# Patient Record
Sex: Male | Born: 1970
Health system: Southern US, Community
[De-identification: ages and names within clinical notes are randomized; demographics above are authoritative.]

## PROBLEM LIST (undated history)

## (undated) DIAGNOSIS — E669 Obesity, unspecified: Secondary | ICD-10-CM

## (undated) DIAGNOSIS — N200 Calculus of kidney: Principal | ICD-10-CM

## (undated) DIAGNOSIS — Z720 Tobacco use: Secondary | ICD-10-CM

## (undated) DIAGNOSIS — Z9289 Personal history of other medical treatment: Secondary | ICD-10-CM

## (undated) DIAGNOSIS — G4733 Obstructive sleep apnea (adult) (pediatric): Secondary | ICD-10-CM

## (undated) DIAGNOSIS — R51 Headache: Secondary | ICD-10-CM

## (undated) DIAGNOSIS — Q631 Lobulated, fused and horseshoe kidney: Secondary | ICD-10-CM

## (undated) DIAGNOSIS — I471 Supraventricular tachycardia, unspecified: Secondary | ICD-10-CM

## (undated) DIAGNOSIS — R12 Heartburn: Secondary | ICD-10-CM

## (undated) DIAGNOSIS — I499 Cardiac arrhythmia, unspecified: Secondary | ICD-10-CM

## (undated) DIAGNOSIS — J3089 Other allergic rhinitis: Secondary | ICD-10-CM

## (undated) DIAGNOSIS — H9319 Tinnitus, unspecified ear: Secondary | ICD-10-CM

## (undated) DIAGNOSIS — Q245 Malformation of coronary vessels: Secondary | ICD-10-CM

## (undated) DIAGNOSIS — IMO0001 Reserved for inherently not codable concepts without codable children: Secondary | ICD-10-CM

## (undated) DIAGNOSIS — R030 Elevated blood-pressure reading, without diagnosis of hypertension: Secondary | ICD-10-CM

## (undated) DIAGNOSIS — Z789 Other specified health status: Secondary | ICD-10-CM

## (undated) DIAGNOSIS — Z9989 Dependence on other enabling machines and devices: Secondary | ICD-10-CM

## (undated) DIAGNOSIS — I2510 Atherosclerotic heart disease of native coronary artery without angina pectoris: Secondary | ICD-10-CM

## (undated) HISTORY — DX: Obstructive sleep apnea (adult) (pediatric): G47.33

## (undated) HISTORY — DX: Malformation of coronary vessels: Q24.5

## (undated) HISTORY — DX: Supraventricular tachycardia: I47.1

## (undated) HISTORY — DX: Cardiac arrhythmia, unspecified: I49.9

## (undated) HISTORY — DX: Other allergic rhinitis: J30.89

## (undated) HISTORY — DX: Headache: R51

## (undated) HISTORY — DX: Lobulated, fused and horseshoe kidney: Q63.1

## (undated) HISTORY — DX: Other specified health status: Z78.9

## (undated) HISTORY — DX: Dependence on other enabling machines and devices: Z99.89

## (undated) HISTORY — DX: Supraventricular tachycardia, unspecified: I47.10

## (undated) HISTORY — DX: Atherosclerotic heart disease of native coronary artery without angina pectoris: I25.10

## (undated) HISTORY — DX: Tinnitus, unspecified ear: H93.19

## (undated) HISTORY — DX: Calculus of kidney: N20.0

## (undated) HISTORY — DX: Elevated blood-pressure reading, without diagnosis of hypertension: R03.0

## (undated) HISTORY — DX: Personal history of other medical treatment: Z92.89

## (undated) HISTORY — DX: Heartburn: R12

## (undated) HISTORY — DX: Tobacco use: Z72.0

## (undated) HISTORY — DX: Reserved for inherently not codable concepts without codable children: IMO0001

## (undated) HISTORY — DX: Obesity, unspecified: E66.9

---
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
## 2005-08-10 ENCOUNTER — Ambulatory Visit: Payer: Self-pay | Admitting: Otolaryngology

## 2005-12-14 ENCOUNTER — Ambulatory Visit: Payer: Self-pay | Admitting: Unknown Physician Specialty

## 2009-02-16 DIAGNOSIS — Z9989 Dependence on other enabling machines and devices: Secondary | ICD-10-CM

## 2009-02-16 DIAGNOSIS — G4733 Obstructive sleep apnea (adult) (pediatric): Secondary | ICD-10-CM

## 2009-02-16 HISTORY — DX: Obstructive sleep apnea (adult) (pediatric): Z99.89

## 2009-02-16 HISTORY — DX: Obstructive sleep apnea (adult) (pediatric): G47.33

## 2010-02-27 ENCOUNTER — Ambulatory Visit: Payer: Self-pay | Admitting: Otolaryngology

## 2012-02-17 DIAGNOSIS — Q631 Lobulated, fused and horseshoe kidney: Secondary | ICD-10-CM

## 2012-02-17 DIAGNOSIS — N200 Calculus of kidney: Secondary | ICD-10-CM

## 2012-02-17 HISTORY — DX: Calculus of kidney: N20.0

## 2012-02-17 HISTORY — DX: Lobulated, fused and horseshoe kidney: Q63.1

## 2012-03-18 ENCOUNTER — Ambulatory Visit (INDEPENDENT_AMBULATORY_CARE_PROVIDER_SITE_OTHER): Payer: 59 | Admitting: Family Medicine

## 2012-03-18 ENCOUNTER — Encounter: Payer: Self-pay | Admitting: Family Medicine

## 2012-03-18 ENCOUNTER — Encounter: Payer: Self-pay | Admitting: *Deleted

## 2012-03-18 VITALS — BP 130/78 | HR 84 | Temp 98.2°F | Ht 73.0 in | Wt 307.8 lb

## 2012-03-18 DIAGNOSIS — Z9989 Dependence on other enabling machines and devices: Secondary | ICD-10-CM | POA: Insufficient documentation

## 2012-03-18 DIAGNOSIS — I499 Cardiac arrhythmia, unspecified: Secondary | ICD-10-CM

## 2012-03-18 DIAGNOSIS — E669 Obesity, unspecified: Secondary | ICD-10-CM

## 2012-03-18 DIAGNOSIS — Z87891 Personal history of nicotine dependence: Secondary | ICD-10-CM | POA: Insufficient documentation

## 2012-03-18 DIAGNOSIS — H9319 Tinnitus, unspecified ear: Secondary | ICD-10-CM | POA: Insufficient documentation

## 2012-03-18 DIAGNOSIS — K219 Gastro-esophageal reflux disease without esophagitis: Secondary | ICD-10-CM

## 2012-03-18 DIAGNOSIS — Z720 Tobacco use: Secondary | ICD-10-CM

## 2012-03-18 DIAGNOSIS — R12 Heartburn: Secondary | ICD-10-CM

## 2012-03-18 DIAGNOSIS — R002 Palpitations: Secondary | ICD-10-CM

## 2012-03-18 DIAGNOSIS — R51 Headache: Secondary | ICD-10-CM

## 2012-03-18 DIAGNOSIS — G4733 Obstructive sleep apnea (adult) (pediatric): Secondary | ICD-10-CM | POA: Insufficient documentation

## 2012-03-18 DIAGNOSIS — J309 Allergic rhinitis, unspecified: Secondary | ICD-10-CM

## 2012-03-18 DIAGNOSIS — F17200 Nicotine dependence, unspecified, uncomplicated: Secondary | ICD-10-CM

## 2012-03-18 DIAGNOSIS — J3089 Other allergic rhinitis: Secondary | ICD-10-CM

## 2012-03-18 MED ORDER — FLUTICASONE PROPIONATE 50 MCG/ACT NA SUSP: 2.0000 | Freq: Every day | NASAL | Status: DC

## 2012-03-18 NOTE — Assessment & Plan Note (Signed)
reports compliance with CPAP machine.

## 2012-03-18 NOTE — Patient Instructions (Signed)
Good to meet you today, call us with questions. Return at your convenience for fasting blood work and afterwards for physical. flonase sent to pharmacy to try for allergies - watch for nosebleeds, use with nasal saline irrigation throught the day. Try to back off caffeine.

## 2012-03-18 NOTE — Assessment & Plan Note (Signed)
Discussed this condition as well as meds to treat. Pt desires trial of INS, suggested he use nasal saline irrigation regularly as well to help decrease epistaxis.

## 2012-03-18 NOTE — Assessment & Plan Note (Addendum)
Check EKG today. No dyspnea or palpitations with exercise, happen more at rest. Anticipate caffeine use contributing, discussed slowly titrating down on caffeine and keeping diary to see if any triggers for these irregular heart beats. If continued, consider referral to cards for holter monitor. EKG - NSR rate 72, normal axis, intervals, no hypertrophy, no acute ST/T changes

## 2012-03-18 NOTE — Assessment & Plan Note (Signed)
Body mass index is 40.60 kg/(m^2).  Discussed importance of continued regular activity

## 2012-03-18 NOTE — Assessment & Plan Note (Signed)
Discussed nicorette gum in detail and park and chew method. Pt motivated to quit.

## 2012-03-18 NOTE — Progress Notes (Signed)
Subjective:    Patient ID: Danny Cox, male    DOB: 07-19-1970, 42 y.o.   MRN: [id number]  HPI CC: new pt  OSA - dx 2009.  Uses CPAP (started 2011).  Tolerating well. H/o HTN - resolved with CPAP.  Sinusitis 2 wks ago - improving.  Since then, having R tinnitus.  H/o chronic HA from sinus congestion. Perennial allergic rhinitis - predominantly congestion and HA.  Has tried allegra in past, too drying.    Nasacort did help some.  Caused nosebleed - that needed cauterization.  Has used nasal saline as well as sinu-cleanse.  Almost passed out recently while driving after laughing really hard.  Occasionally feels mild dyspnea.  Occasional irregular heart beats.  No trouble when exercising - cardio, weights several times a week.  Never trouble while exercising or working.  Feels more when resting.  Last EKG done 10 yrs ago.  States longstanding h/o irregular heart beats Caffeine - 3 cups daily, red bull 3+ times a week.  Saw ENT in [facility]) (Dr Lane Hacker) - for OSA.  Dips - would like help quitting this.  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  Preventative: No recent CPE, would like this. 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  Past histories reviewed and updated if relevant as below. There is no problem list on file for this patient.  Past Medical History  Diagnosis Date  . Headache     sinus  . GERD (gastroesophageal reflux disease)     tums  . Perennial allergic rhinitis     predominantly congestion  . Irregular heart rhythm   . Tinnitus     right ear  . OSA on CPAP 2011  . Elevated BP     resolved with OSA   Past Surgical  History  Procedure Date  . Tonsillectomy and adenoidectomy 1984   History  Substance Use Topics  . Smoking status: Never Smoker   . Smokeless tobacco: Current User    Types: Snuff     Comment: 1 can/day  . Alcohol Use: Yes     Comment: occasional   Family History  Problem Relation Age of Onset  . Stroke Paternal Grandfather     mini  . Prostatitis Father   . Diabetes Paternal Grandfather   . Cancer Neg Hx   . CAD Neg Hx   . Hypertension Neg Hx    Allergies  Allergen Reactions  . Sulfa Antibiotics Other (See Comments)    Rash and swelling with trouble breathing   Current Outpatient Prescriptions on File Prior to Visit  Medication Sig Dispense Refill  . fluticasone (FLONASE) 50 MCG/ACT nasal spray Place 2 sprays into the nose daily.  16 g  3      Review of Systems  Constitutional: Positive for fever (sinus infx recently). Negative for chills, activity change, appetite change, fatigue and unexpected weight change.  HENT: Positive for tinnitus (R ear x 2 wks). Negative for hearing loss and neck pain.   Eyes: Negative for visual disturbance.  Respiratory: Positive for shortness of breath (occasional). Negative for cough, chest tightness and wheezing.   Cardiovascular: Negative for chest pain,  palpitations and leg swelling.  Gastrointestinal: Negative for nausea, vomiting, abdominal pain, diarrhea, constipation, blood in stool and abdominal distention.  Genitourinary: Positive for hematuria (tr blood recent DOT CPE). Negative for difficulty urinating.  Musculoskeletal: Negative for myalgias and arthralgias.  Skin: Negative for rash.  Neurological: Negative for dizziness, seizures, syncope and headaches.  Hematological: Does not bruise/bleed easily.  Psychiatric/Behavioral: Negative for dysphoric mood. The patient is not nervous/anxious.        Objective:   Physical Exam  Nursing note and vitals reviewed. Constitutional: He is oriented to person, place, and time. He  appears well-developed and well-nourished. No distress.  HENT:  Head: Normocephalic and atraumatic.  Right Ear: Hearing, tympanic membrane, external ear and ear canal normal.  Left Ear: Hearing, tympanic membrane, external ear and ear canal normal.  Mouth/Throat: Oropharynx is clear and moist. No oropharyngeal exudate.  Eyes: Conjunctivae normal and EOM are normal. Pupils are equal, round, and reactive to light. No scleral icterus.  Neck: Normal range of motion. Neck supple. Carotid bruit is not present. No thyromegaly present.  Cardiovascular: Normal rate, regular rhythm, normal heart sounds and intact distal pulses.   No murmur heard. Pulses:      Radial pulses are 2+ on the right side, and 2+ on the left side.       Regular today  Pulmonary/Chest: Effort normal and breath sounds normal. No respiratory distress. He has no wheezes. He has no rales.  Musculoskeletal: Normal range of motion. He exhibits no edema.  Lymphadenopathy:    He has no cervical adenopathy.  Neurological: He is alert and oriented to person, place, and time.       CN grossly intact, station and gait intact  Skin: Skin is warm and dry. No rash noted.  Psychiatric: He has a normal mood and affect. His behavior is normal. Judgment and thought content normal.      Assessment & Plan:  Will need UA next visit to eval for h/o tr prot, blood.

## 2012-03-18 NOTE — Assessment & Plan Note (Signed)
Does regularly use NSAIDs/ASA - discussed trying to back off aspirin. Ear exam normal today possibly related to recent sinusitis.

## 2012-04-04 ENCOUNTER — Other Ambulatory Visit (INDEPENDENT_AMBULATORY_CARE_PROVIDER_SITE_OTHER): Payer: 59

## 2012-04-04 DIAGNOSIS — E669 Obesity, unspecified: Secondary | ICD-10-CM

## 2012-04-04 LAB — LIPID PANEL
Cholesterol: 212 mg/dL — ABNORMAL HIGH (ref 0–200)
HDL: 41.7 mg/dL (ref >39.00)
VLDL: 25.2 mg/dL (ref 0.0–40.0)

## 2012-04-04 LAB — COMPREHENSIVE METABOLIC PANEL
AST: 21 U/L (ref 0–37)
Alkaline Phosphatase: 45 U/L (ref 39–117)
BUN: 18 mg/dL (ref 6–23)
Calcium: 9.8 mg/dL (ref 8.4–10.5)
Chloride: 102 mEq/L (ref 96–112)
Creatinine, Ser: 1.1 mg/dL (ref 0.4–1.5)
Glucose, Bld: 105 mg/dL — ABNORMAL HIGH (ref 70–99)

## 2012-04-04 LAB — TSH: TSH: 1.44 u[IU]/mL (ref 0.35–5.50)

## 2012-04-04 LAB — LDL CHOLESTEROL, DIRECT: Direct LDL: 138.7 mg/dL

## 2012-04-08 ENCOUNTER — Ambulatory Visit (INDEPENDENT_AMBULATORY_CARE_PROVIDER_SITE_OTHER): Payer: 59 | Admitting: Family Medicine

## 2012-04-08 ENCOUNTER — Encounter: Payer: Self-pay | Admitting: Family Medicine

## 2012-04-08 VITALS — BP 132/94 | HR 78 | Temp 97.8°F | Ht 73.0 in | Wt 309.2 lb

## 2012-04-08 DIAGNOSIS — Z9989 Dependence on other enabling machines and devices: Secondary | ICD-10-CM

## 2012-04-08 DIAGNOSIS — R319 Hematuria, unspecified: Secondary | ICD-10-CM | POA: Insufficient documentation

## 2012-04-08 DIAGNOSIS — Z0000 Encounter for general adult medical examination without abnormal findings: Secondary | ICD-10-CM | POA: Insufficient documentation

## 2012-04-08 DIAGNOSIS — H9319 Tinnitus, unspecified ear: Secondary | ICD-10-CM

## 2012-04-08 DIAGNOSIS — I499 Cardiac arrhythmia, unspecified: Secondary | ICD-10-CM

## 2012-04-08 DIAGNOSIS — E669 Obesity, unspecified: Secondary | ICD-10-CM

## 2012-04-08 DIAGNOSIS — G4733 Obstructive sleep apnea (adult) (pediatric): Secondary | ICD-10-CM

## 2012-04-08 LAB — POCT URINALYSIS DIPSTICK
Ketones, UA: NEGATIVE
Protein, UA: NEGATIVE
Spec Grav, UA: 1.01
Urobilinogen, UA: 0.2
pH, UA: 7.5

## 2012-04-08 NOTE — Assessment & Plan Note (Signed)
Noted significant improvement with decreased caffeine intake.

## 2012-04-08 NOTE — Assessment & Plan Note (Signed)
Remains compliant with CPAP 

## 2012-04-08 NOTE — Assessment & Plan Note (Addendum)
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 UA today - trace blood on dip - micro with rare RBC, reassuring.

## 2012-04-08 NOTE — Progress Notes (Signed)
Subjective:    Patient ID: Danny Cox, male    DOB: 1970-12-06, 42 y.o.   MRN: [id number]  HPI CC: CPE  Caffeine - has cut down on caffeine (cofee and red bull) - notices helping with palpitations.  Tinnitus - noticed over last 3 months.  High frequency in R ear - notices more at night time.  Has cut down on ibuprofen and aspirin but hasn't noticed improvement.  No hearing loss endorsed.  Had hearing test for CDL and told had high frequency hearing loss on left side.  Interested in ENT eval.  Continues dipping.  H/o OSA - improvement with CPAP.  Obesity - discussed weight.  More active new job.  Started 3 wks ago. 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   Preventative:  Declines flu  Tetanus - thinks 2006   Seat belt use discussed - 95% time, rec 100%. Sunscreen use discussed.  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  Past histories reviewed and updated if relevant as below. Patient Active Problem List  Diagnosis  . Headache  . Perennial allergic rhinitis  . Heartburn  . Irregular heart rhythm  . Tinnitus  . OSA on CPAP  . Tobacco dipper  . Obesity   Past Medical History  Diagnosis Date  . Headache     sinus  . Heartburn     tums  . Perennial allergic rhinitis     predominantly congestion  . Irregular heart rhythm   . Tinnitus     right ear  . OSA on CPAP 2011  . Elevated BP     resolved with OSA treatment  . Tobacco dipper   . Obesity    Past Surgical History  Procedure Laterality Date  . Tonsillectomy and adenoidectomy  1984   History  Substance  Use Topics  . Smoking status: Never Smoker   . Smokeless tobacco: Current User    Types: Snuff     Comment: 1 can/day  . Alcohol Use: Yes     Comment: occasional   Family History  Problem Relation Age of Onset  . Stroke Paternal Grandfather     mini  . Prostatitis Father   . Diabetes Paternal Grandfather   . Cancer Neg Hx   . CAD Neg Hx   . Hypertension Neg Hx    Allergies  Allergen Reactions  . Sulfa Antibiotics Other (See Comments)    Rash and swelling with trouble breathing   Current Outpatient Prescriptions on File Prior to Visit  Medication Sig Dispense Refill  . fluticasone (FLONASE) 50 MCG/ACT nasal spray Place 2 sprays into the nose daily.  16 g  3  . Multiple Vitamin (MULTIVITAMIN) tablet Take 1 tablet by mouth daily.       No current facility-administered medications on file prior to visit.     Review of Systems  Constitutional: Negative for fever, chills, activity change, appetite change, fatigue and unexpected weight change.  HENT: Positive for tinnitus (continued R>L). Negative for hearing loss and neck pain.   Eyes:  Negative for visual disturbance.  Respiratory: Negative for cough, chest tightness, shortness of breath and wheezing.   Cardiovascular: Negative for chest pain, palpitations and leg swelling.  Gastrointestinal: Negative for nausea, vomiting, abdominal pain, diarrhea, constipation, blood in stool and abdominal distention.  Genitourinary: Positive for hematuria (positive on CDL). Negative for difficulty urinating.  Musculoskeletal: Negative for myalgias and arthralgias.  Skin: Negative for rash.  Neurological: Negative for dizziness, seizures, syncope and headaches.  Hematological: Does not bruise/bleed easily.  Psychiatric/Behavioral: Negative for dysphoric mood. The patient is not nervous/anxious.        Objective:   Physical Exam  Nursing note and vitals reviewed. Constitutional: He is oriented to person, place, and time. He appears  well-developed and well-nourished. No distress.  HENT:  Head: Normocephalic and atraumatic.  Right Ear: Hearing, tympanic membrane, external ear and ear canal normal.  Left Ear: Hearing, tympanic membrane, external ear and ear canal normal.  Nose: Nose normal.  Mouth/Throat: Oropharynx is clear and moist. No oropharyngeal exudate.  Eyes: Conjunctivae and EOM are normal. Pupils are equal, round, and reactive to light. No scleral icterus.  Neck: Normal range of motion. Neck supple. Carotid bruit is not present.  Cardiovascular: Normal rate, regular rhythm, normal heart sounds and intact distal pulses.   No murmur heard. Pulses:      Radial pulses are 2+ on the right side, and 2+ on the left side.  Pulmonary/Chest: Effort normal and breath sounds normal. No respiratory distress. He has no wheezes. He has no rales.  Abdominal: Soft. Bowel sounds are normal. He exhibits no distension and no mass. There is no tenderness. There is no rebound and no guarding.  Musculoskeletal: Normal range of motion. He exhibits no edema.  Lymphadenopathy:    He has no cervical adenopathy.  Neurological: He is alert and oriented to person, place, and time.  CN grossly intact, station and gait intact Normal rhinne bilaterally. Weber localizes to left  Skin: Skin is warm and dry. No rash noted.  Psychiatric: He has a normal mood and affect. His behavior is normal. Judgment and thought content normal.      Assessment & Plan:

## 2012-04-08 NOTE — Assessment & Plan Note (Signed)
Encouraged weight loss. Body mass index is 40.81 kg/(m^2).

## 2012-04-08 NOTE — Assessment & Plan Note (Signed)
Preventative protocols reviewed and updated unless pt declined. Discussed healthy diet and lifestyle.  

## 2012-04-08 NOTE — Patient Instructions (Addendum)
Good job with blood work - watch sugar and simple carbs. Urine checked today. Return as needed or in 1 year for physical Pass by Marion's office for appointment with ENT.

## 2012-04-08 NOTE — Assessment & Plan Note (Addendum)
Pt denies hearing loss.  Hearing screen today - persistent high frequency hearing loss on left Weber test heard louder on left. Refer to ENT for further eval of L hearing loss and tinnitus per pt request.

## 2012-04-09 ENCOUNTER — Emergency Department: Payer: Self-pay | Admitting: Internal Medicine

## 2012-04-09 LAB — URINALYSIS, COMPLETE
Ketone: NEGATIVE
Leukocyte Esterase: NEGATIVE
Nitrite: NEGATIVE
Protein: NEGATIVE
RBC,UR: 122 /HPF (ref 0–5)
Squamous Epithelial: <1

## 2012-04-09 LAB — CBC
HGB: 15 g/dL (ref 13.0–18.0)
MCH: 28.6 pg (ref 26.0–34.0)
MCV: 85 fL (ref 80–100)
RBC: 5.24 10*6/uL (ref 4.40–5.90)
RDW: 13.1 % (ref 11.5–14.5)
WBC: 12.9 10*3/uL — ABNORMAL HIGH (ref 3.8–10.6)

## 2012-04-09 LAB — BASIC METABOLIC PANEL
BUN: 24 mg/dL — ABNORMAL HIGH (ref 7–18)
Chloride: 108 mmol/L — ABNORMAL HIGH (ref 98–107)
Creatinine: 1.42 mg/dL — ABNORMAL HIGH (ref 0.60–1.30)
EGFR (Non-African Amer.): >60
Glucose: 98 mg/dL (ref 65–99)
Osmolality: 285 (ref 275–301)
Potassium: 3.7 mmol/L (ref 3.5–5.1)

## 2012-04-15 ENCOUNTER — Ambulatory Visit (INDEPENDENT_AMBULATORY_CARE_PROVIDER_SITE_OTHER): Payer: 59 | Admitting: Family Medicine

## 2012-04-15 ENCOUNTER — Encounter: Payer: Self-pay | Admitting: Family Medicine

## 2012-04-15 VITALS — BP 132/88 | HR 80 | Temp 97.7°F | Wt 306.2 lb

## 2012-04-15 DIAGNOSIS — N200 Calculus of kidney: Secondary | ICD-10-CM | POA: Insufficient documentation

## 2012-04-15 NOTE — Patient Instructions (Addendum)
Drink lots of fluids We will request records from ER. We weill send off stone for analysis. Call us with questions.  Kidney Stones Kidney stones (ureteral lithiasis) are deposits that form inside your kidneys. The intense pain is caused by the stone moving through the urinary tract. When the stone moves, the ureter goes into spasm around the stone. The stone is usually passed in the urine.  CAUSES   A disorder that makes certain neck glands produce too much parathyroid hormone (primary hyperparathyroidism).  A buildup of uric acid crystals.  Narrowing (stricture) of the ureter.  A kidney obstruction present at birth (congenital obstruction).  Previous surgery on the kidney or ureters.  Numerous kidney infections. SYMPTOMS   Feeling sick to your stomach (nauseous).  Throwing up (vomiting).  Blood in the urine (hematuria).  Pain that usually spreads (radiates) to the groin.  Frequency or urgency of urination. DIAGNOSIS   Taking a history and physical exam.  Blood or urine tests.  Computerized X-ray scan (CT scan).  Occasionally, an examination of the inside of the urinary bladder (cystoscopy) is performed. TREATMENT   Observation.  Increasing your fluid intake.  Surgery may be needed if you have severe pain or persistent obstruction. The size, location, and chemical composition are all important variables that will determine the proper choice of action for you. Talk to your caregiver to better understand your situation so that you will minimize the risk of injury to yourself and your kidney.  HOME CARE INSTRUCTIONS   Drink enough water and fluids to keep your urine clear or pale yellow.  Strain all urine through the provided strainer. Keep all particulate matter and stones for your caregiver to see. The stone causing the pain may be as small as a grain of salt. It is very important to use the strainer each and every time you pass your urine. The collection of your  stone will allow your caregiver to analyze it and verify that a stone has actually passed.  Only take over-the-counter or prescription medicines for pain, discomfort, or fever as directed by your caregiver.  Make a follow-up appointment with your caregiver as directed.  Get follow-up X-rays if required. The absence of pain does not always mean that the stone has passed. It may have only stopped moving. If the urine remains completely obstructed, it can cause loss of kidney function or even complete destruction of the kidney. It is your responsibility to make sure X-rays and follow-ups are completed. Ultrasounds of the kidney can show blockages and the status of the kidney. Ultrasounds are not associated with any radiation and can be performed easily in a matter of minutes. SEEK IMMEDIATE MEDICAL CARE IF:   Pain cannot be controlled with the prescribed medicine.  You have a fever.  The severity or intensity of pain increases over 18 hours and is not relieved by pain medicine.  You develop a new onset of abdominal pain.  You feel faint or pass out. MAKE SURE YOU:   Understand these instructions.  Will watch your condition.  Will get help right away if you are not doing well or get worse. Document Released: 02/02/2005 Document Revised: 04/27/2011 Document Reviewed: 05/31/2009 [facility] Patient Information 2013 [place], [place].

## 2012-04-15 NOTE — Assessment & Plan Note (Signed)
New. I have requested records from [facility] ER. Last Ca and TSH WNL. Currently pain free. Discussed use of aleve prn and importance of hydration status. Brings stone - will send for analysis.

## 2012-04-15 NOTE — Progress Notes (Addendum)
  Subjective:    Patient ID: Danny Cox, male    DOB: 30-Mar-1970, 42 y.o.   MRN: [id number]  HPI CC: nephrolithiasis  Day after CPE - pain R testicle and into lower abdomen.  Pain started radiating to R side and flank.  Severe pain. 20/10 pain. Treated at [facility] with percocets and IV morphine and then IV dilaudid with best resolution of pain.  CT scan - found R sided kidney stone also dx with horseshoe kidney.  Sent home with percocets and strainer.  Also had sensation of incomplete emptying while had pain.  Pain relief since Tuesday.    Brings strainer with residues of kidney stone.  Will send analysis.  Staying hydrated with water.    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  On repeat at our physical last week, micro reassuring with rare RBC/hpf.  Lab Results  Component Value Date   CREATININE 1.1 04/04/2012    Past Medical History  Diagnosis Date  . Headache     sinus  . Heartburn     tums  . Perennial allergic rhinitis     predominantly congestion  . Irregular heart rhythm   . Tinnitus     right ear  . OSA on CPAP 2011  . Elevated BP     resolved with OSA treatment  . Tobacco dipper   . Obesity     Review of Systems Per HPI    Objective:   Physical Exam  Nursing note and vitals reviewed. Constitutional: He appears well-developed and well-nourished. No distress.  Abdominal: Soft. Bowel sounds are normal. He exhibits no distension and no mass. There is no hepatosplenomegaly. There is tenderness in the right lower quadrant. There is no rebound, no guarding, no CVA tenderness and negative Murphy's sign.  Mild RLQ discomfort to deep palpation       Assessment & Plan:  ==> ADDENDUM: reviewed records from [facility] ER. Cr 1.42, WBC 12.9, UA WNL CT abd /pelvis - horseshoe kidney, mild hydronephrosis 2/2 R 1mm UVJ stone

## 2012-04-25 ENCOUNTER — Encounter: Payer: Self-pay | Admitting: Family Medicine

## 2012-08-16 ENCOUNTER — Telehealth: Payer: Self-pay

## 2012-08-16 MED ORDER — HYDROCODONE-ACETAMINOPHEN 5-300 MG PO TABS: 1.0000 | ORAL_TABLET | Freq: Four times a day (QID) | ORAL | Status: DC | PRN

## 2012-08-16 NOTE — Telephone Encounter (Signed)
Phoned in hydrocodone and spoke with patient. Did pass prior kidney stone - 1mm stone did have mild hydronephrosis.  Cal ox stones H/o horseshoe kidney. No fevers/nausea, dysuria. Discussed in detail - if persistent discomfort will need to come in for evaluation.  Pt agrees.

## 2012-08-16 NOTE — Telephone Encounter (Signed)
Pt seen in 03/2012 with kidney stone; on 08/14/12 started with intermittent pain in lower back and abdomen; no problem urinating and no N or V. Pt feels like he has another kidney stone. Pt has been taking Ibuprofen 200 mg x 4 as needed for pain. Pt said ED had given pt oxycodone for pain and pt request oxycodone rx to have if pain intensifies. Now pain level is 5. Pt said he does not want to come in for appt or go to ED because symptoms are same as 03/2012. [facility].Please advise.pt request cb.

## 2012-10-14 ENCOUNTER — Ambulatory Visit: Payer: 59 | Admitting: Family Medicine

## 2012-11-03 ENCOUNTER — Ambulatory Visit (INDEPENDENT_AMBULATORY_CARE_PROVIDER_SITE_OTHER): Payer: 59 | Admitting: Family Medicine

## 2012-11-03 ENCOUNTER — Encounter: Payer: Self-pay | Admitting: *Deleted

## 2012-11-03 ENCOUNTER — Encounter: Payer: Self-pay | Admitting: Family Medicine

## 2012-11-03 VITALS — BP 126/84 | HR 83 | Temp 97.7°F | Wt 310.2 lb

## 2012-11-03 DIAGNOSIS — R232 Flushing: Secondary | ICD-10-CM

## 2012-11-03 DIAGNOSIS — T7840XA Allergy, unspecified, initial encounter: Secondary | ICD-10-CM

## 2012-11-03 MED ORDER — PREDNISONE 20 MG PO TABS: ORAL_TABLET | ORAL | Status: DC

## 2012-11-03 MED ORDER — MOMETASONE FUROATE 50 MCG/ACT NA SUSP: 2.0000 | Freq: Every day | NASAL | Status: DC

## 2012-11-03 NOTE — Progress Notes (Addendum)
  Subjective:    Patient ID: Danny Cox, male    DOB: 1970/04/20, 42 y.o.   MRN: [id number]  HPI CC: mold exposure  1 mo ago wife started having resp issues - identified recent exposure to mold at home.  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  Exposed to this for last 7-8 months Out of home for last 3 weeks.  Pt thinks he may have also had reaction to mold - over last month increased parietal headaches, swollen neck LN, and msk pain in chest around clavicles, increased sputum production in the morning.  Also having lower abd pain and flank pain.  At first thought related to kidney stones, but then thought related more likely to mold exposure (never filled hydrocodone).  Compliant with CPAP machine.  Denies fevers/chills, ear or tooth pain, no sinus pressure headache, no facial pain.  Hot flashes with flushing - ongoing issue.  Some diarrhea with this, no hypertension. Wt Readings from Last 3 Encounters:  11/03/12 310 lb 4 oz (140.728 kg)  04/15/12 306 lb 4 oz (138.914 kg)  04/08/12 309 lb 4 oz (140.275 kg)    Past Medical History  Diagnosis Date  . Headache(784.0)     sinus  . Heartburn     tums  . Perennial allergic rhinitis     predominantly congestion  . Irregular heart rhythm   . Tinnitus     right ear  . OSA on CPAP 2011  . Elevated BP     resolved with OSA treatment  . Tobacco dipper   . Obesity   . Nephrolithiasis 2014    ca oxalate  . Horseshoe kidney 2014    found incidentally on CT abd for kidney stone    Review of Systems Per HPI    Objective:   Physical Exam  Nursing note and vitals reviewed. Constitutional: He appears well-developed and well-nourished. No distress.  HENT:  Head: Normocephalic and atraumatic.  Right Ear: Hearing, tympanic membrane, external ear and ear canal normal.  Left Ear: Hearing, tympanic membrane, external ear and ear canal normal.  Nose: Mucosal edema and rhinorrhea present. Right sinus  exhibits no maxillary sinus tenderness and no frontal sinus tenderness. Left sinus exhibits no maxillary sinus tenderness and no frontal sinus tenderness.  Mouth/Throat: Uvula is midline, oropharynx is clear and moist and mucous membranes are normal. No oropharyngeal exudate, posterior oropharyngeal edema, posterior oropharyngeal erythema or tonsillar abscesses.  Eyes: Conjunctivae and EOM are normal. Pupils are equal, round, and reactive to light. No scleral icterus.  Neck: Normal range of motion. Neck supple.  Cardiovascular: Normal rate, regular rhythm, normal heart sounds and intact distal pulses.   No murmur heard. Pulmonary/Chest: Effort normal and breath sounds normal. No respiratory distress. He has no decreased breath sounds. He has no wheezes. He has no rales. He exhibits no tenderness.  No pain with palpation of costochondral junction or at clavicles.  Abdominal: Soft. Normal appearance and bowel sounds are normal. He exhibits no distension and no mass. There is no hepatosplenomegaly. There is tenderness in the right upper quadrant. There is no rigidity, no rebound, no guarding, no CVA tenderness and negative Murphy's sign.  Mild RUQ pain  Lymphadenopathy:    He has no cervical adenopathy.  Skin: Skin is warm and dry. No rash noted.       Assessment & Plan:

## 2012-11-03 NOTE — Assessment & Plan Note (Signed)
Anticipate allergic or sensitivity reaction to recent mold exposure - flaring allergic rhinitis. rec treat as such with antihistamine and nasal steroid combination. If not better, may fill oral steroid. No evidence of fungal infection today.

## 2012-11-03 NOTE — Patient Instructions (Signed)
I think this mold has caused a sensitivity or allergic reaction - start antihistamine like claritin or zyrtec. Start nasonex nasal steroid. May fill steroid if needed (of above isn't helping). Watch for fever, or any cough or worsening congestion/sinus pain. Let us know if not improving as expected.

## 2012-11-04 ENCOUNTER — Telehealth: Payer: Self-pay | Admitting: Family Medicine

## 2012-11-04 DIAGNOSIS — R232 Flushing: Secondary | ICD-10-CM

## 2012-11-04 NOTE — Assessment & Plan Note (Signed)
Along with abd discomfort and endorsed diarrhea.  Weight stable. Will check 24 hour urine 5HIAA to eval for carcinoid syndrome.

## 2012-11-04 NOTE — Telephone Encounter (Signed)
plz notify - to further evaluate his flushing and diarrhea I'd like him to complete a 24 hour urine test - to come in to collect container for urine specimen.  Avoid tryptophan rich foods for 3 days prior to collecting urine: avocados, pineapples, bananas, kiwi fruit, plums, eggplants, walnuts, hickory nuts, pecans, tomatoes, plantains

## 2012-11-07 NOTE — Telephone Encounter (Signed)
Patient notified and will come by to pick up container. List of foods to avoid printed for patient.

## 2012-11-16 ENCOUNTER — Other Ambulatory Visit (INDEPENDENT_AMBULATORY_CARE_PROVIDER_SITE_OTHER): Payer: 59

## 2012-11-16 DIAGNOSIS — R232 Flushing: Secondary | ICD-10-CM

## 2012-11-21 LAB — 5 HIAA W/CREATININE, 24 HR
5-HIAA: 4.1 mg/24 h (ref <=6.0)
Total Volume: 2900 mL

## 2012-11-22 ENCOUNTER — Encounter: Payer: Self-pay | Admitting: *Deleted

## 2012-11-28 ENCOUNTER — Telehealth: Payer: Self-pay

## 2012-11-28 NOTE — Telephone Encounter (Signed)
Pt request recent 24 hr urine ck; pt notified as instructed by result note 11/21/12. Pt said he is feeling better.

## 2012-12-14 ENCOUNTER — Other Ambulatory Visit: Payer: Self-pay | Admitting: Family Medicine

## 2013-03-16 ENCOUNTER — Ambulatory Visit (INDEPENDENT_AMBULATORY_CARE_PROVIDER_SITE_OTHER): Payer: 59 | Admitting: Family Medicine

## 2013-03-16 ENCOUNTER — Encounter: Payer: Self-pay | Admitting: Family Medicine

## 2013-03-16 VITALS — BP 116/84 | HR 81 | Temp 98.1°F | Wt 313.2 lb

## 2013-03-16 DIAGNOSIS — Q638 Other specified congenital malformations of kidney: Secondary | ICD-10-CM

## 2013-03-16 DIAGNOSIS — Q631 Lobulated, fused and horseshoe kidney: Secondary | ICD-10-CM

## 2013-03-16 DIAGNOSIS — R1030 Lower abdominal pain, unspecified: Secondary | ICD-10-CM

## 2013-03-16 DIAGNOSIS — R109 Unspecified abdominal pain: Secondary | ICD-10-CM

## 2013-03-16 LAB — POCT URINALYSIS DIPSTICK
BILIRUBIN UA: NEGATIVE
GLUCOSE UA: NEGATIVE
Ketones, UA: NEGATIVE
Leukocytes, UA: NEGATIVE
Nitrite, UA: NEGATIVE
Protein, UA: NEGATIVE
Spec Grav, UA: 1.025
Urobilinogen, UA: NEGATIVE
pH, UA: 6

## 2013-03-16 MED ORDER — MOMETASONE FUROATE 50 MCG/ACT NA SUSP: NASAL | Status: DC

## 2013-03-16 NOTE — Progress Notes (Signed)
Pre-visit discussion using our clinic review tool. No additional management support is needed unless otherwise documented below in the visit note.  

## 2013-03-16 NOTE — Progress Notes (Addendum)
   Subjective:    Patient ID: Danny Cox, male    DOB: 1970-04-24, 43 y.o.   MRN: [id number]  HPI CC: abd discomfort  Intermittent episodes of abd discomfort ongoing for last 6 weeks.  At times RLQ, at times periumbilical, at times LLQ at belt line.  Describes pain as sharp to burning pain.  No dysuria or frequency.  No hematuria.  Does tend to hold urine in at work with some urgency.  Denies blood in stool, constipation or diarrhea.  Some less frequent BMs than normal.  No nauesa/vomiting, fevers/chills.  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  No abd surgeries in the past.  Appendix remains.  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  Past Medical History  Diagnosis Date  . Headache(784.0)     sinus  . Heartburn     tums  . Perennial allergic rhinitis     predominantly congestion  . Irregular heart rhythm   . Tinnitus     right ear  . OSA on CPAP 2011  . Elevated BP     resolved with OSA treatment  . Tobacco dipper   . Obesity   . Nephrolithiasis 2014    ca oxalate  . Horseshoe kidney 2014    found incidentally on CT abd for kidney stone    Past Surgical History  Procedure Laterality Date  . Tonsillectomy and adenoidectomy  1984   Family History  Problem Relation Age of Onset  . Stroke Paternal Grandfather     mini  . Prostatitis Father   . Diabetes Paternal Grandfather   . Cancer Neg Hx   . CAD Neg Hx   . Hypertension Neg Hx     Review of Systems Per HPI    Objective:   Physical Exam  Nursing note and vitals reviewed. Constitutional: He appears well-developed and well-nourished. No distress.  HENT:  Mouth/Throat: Oropharynx is clear and moist. No oropharyngeal exudate.  Cardiovascular: Normal rate, regular rhythm, normal heart sounds and intact distal pulses.   No murmur heard. Pulmonary/Chest: Effort normal and breath sounds normal. No respiratory distress.  He has no wheezes. He has no rales.  Abdominal: Soft. Normal appearance and bowel sounds are normal. He exhibits no distension and no mass. There is no hepatosplenomegaly. There is generalized tenderness (mild). There is no rigidity, no rebound, no guarding, no CVA tenderness and negative Murphy's sign.  Genitourinary: Rectum normal and prostate normal. Rectal exam shows no external hemorrhoid, no internal hemorrhoid, no fissure, no mass, no tenderness and anal tone normal. Prostate is not enlarged (20gm) and not tender.  Somewhat boggy prostate but not tender  Musculoskeletal: He exhibits no edema.  Skin: Skin is warm and dry. No rash noted.  Psychiatric: He has a normal mood and affect.       Assessment & Plan:

## 2013-03-16 NOTE — Patient Instructions (Signed)
Urine looking overall ok. Blood work today. We will call you with results. Good to see you today.

## 2013-03-16 NOTE — Assessment & Plan Note (Addendum)
nonacute abdomen today. Some descriptive of prostatitis, however no prostate pain on exam today.  Not consistent with nephrolithiasis.  No consistent with diverticulitis. Check UA - tr blood, micro with 0-3 RBC. Check CBC, CMP today. If unrevealing, consider abd US to evaluate h/o horseshoe kidney with hydronephrosis last year.

## 2013-03-17 ENCOUNTER — Telehealth: Payer: Self-pay | Admitting: Family Medicine

## 2013-03-17 DIAGNOSIS — Q631 Lobulated, fused and horseshoe kidney: Secondary | ICD-10-CM | POA: Insufficient documentation

## 2013-03-17 LAB — COMPREHENSIVE METABOLIC PANEL
ALBUMIN: 4.4 g/dL (ref 3.5–5.2)
ALK PHOS: 48 U/L (ref 39–117)
ALT: 27 U/L (ref 0–53)
AST: 23 U/L (ref 0–37)
BUN: 15 mg/dL (ref 6–23)
CALCIUM: 9.4 mg/dL (ref 8.4–10.5)
CHLORIDE: 105 meq/L (ref 96–112)
CO2: 25 meq/L (ref 19–32)
Creatinine, Ser: 0.9 mg/dL (ref 0.4–1.5)
GFR: 95.31 mL/min (ref >60.00)
GLUCOSE: 97 mg/dL (ref 70–99)
POTASSIUM: 3.8 meq/L (ref 3.5–5.1)
SODIUM: 138 meq/L (ref 135–145)
TOTAL PROTEIN: 8 g/dL (ref 6.0–8.3)
Total Bilirubin: 0.6 mg/dL (ref 0.3–1.2)

## 2013-03-17 LAB — CBC WITH DIFFERENTIAL/PLATELET
Basophils Absolute: 0 10*3/uL (ref 0.0–0.1)
Basophils Relative: 0.4 % (ref 0.0–3.0)
EOS PCT: 4.5 % (ref 0.0–5.0)
Eosinophils Absolute: 0.5 10*3/uL (ref 0.0–0.7)
HCT: 46.1 % (ref 39.0–52.0)
Hemoglobin: 15.3 g/dL (ref 13.0–17.0)
LYMPHS PCT: 33.9 % (ref 12.0–46.0)
Lymphs Abs: 3.5 10*3/uL (ref 0.7–4.0)
MCHC: 33.2 g/dL (ref 30.0–36.0)
MCV: 87 fl (ref 78.0–100.0)
MONO ABS: 0.7 10*3/uL (ref 0.1–1.0)
Monocytes Relative: 6.6 % (ref 3.0–12.0)
NEUTROS PCT: 54.6 % (ref 43.0–77.0)
Neutro Abs: 5.6 10*3/uL (ref 1.4–7.7)
PLATELETS: 276 10*3/uL (ref 150.0–400.0)
RBC: 5.3 Mil/uL (ref 4.22–5.81)
RDW: 13.1 % (ref 11.5–14.6)
WBC: 10.2 10*3/uL (ref 4.5–10.5)

## 2013-03-17 NOTE — Telephone Encounter (Signed)
Relevant patient education assigned to patient using Emmi. ° °

## 2013-03-19 ENCOUNTER — Other Ambulatory Visit: Payer: Self-pay | Admitting: Family Medicine

## 2013-03-19 MED ORDER — CIPROFLOXACIN HCL 500 MG PO TABS
500.0000 mg | ORAL_TABLET | Freq: Two times a day (BID) | ORAL | Status: DC
Start: 2013-03-19 — End: 2013-05-25

## 2013-05-18 ENCOUNTER — Ambulatory Visit: Payer: 59 | Admitting: Family Medicine

## 2013-05-22 ENCOUNTER — Telehealth: Payer: Self-pay | Admitting: Family Medicine

## 2013-05-22 NOTE — Telephone Encounter (Signed)
Patient Information:  Caller Name: Danny Cox  Phone: [phone]  Patient: Danny Cox, Danny Cox  Gender: Male  DOB: 11-16-1970  Age: 43 Years  PCP: Eustaquio BoydenGutierrez, [facility](Family Practice)  Office Follow Up:  Does the office need to follow up with this patient?: No  Instructions For The Office: N/A  RN Note:  ER CALL. Intermittent Abdominal Pain radiating into Groin, onset 1 week, Pain worsen on 4-6, Lower Back pain started on 4-5.  Urine has rusty color content, Pt is drinking lots of water.  Hx of passing Kidney Stones "around same time last year".  Pt has appt on 4-9.  [place] at [place].  All emergent sxs ruled out per Abdominal pain protocol, see today d/t mild pain that comes and goes over 24 hrs.  Pt requesting office appt. Appt scheduled at 11:15 on 4-7 w/ Dr Alphonsus SiasLetvak, no availablity w/ Dr Sharen HonesGutierrez, PCP.  Advised Pt to go to ED if blood is noticed in urine, pain becomes unbearable or radiates into testicles.  Pt verbalized understanding.  Symptoms  Reason For Call & Symptoms: ER CALL. Intermittent Abdominal Pain radiating into Groin, onset 1 week, Pain worsen on 4-6, Lower Back pain started on 4-5.  Reviewed Health History In EMR: N/A  Reviewed Medications In EMR: N/A  Reviewed Allergies In EMR: N/A  Reviewed Surgeries / Procedures: N/A  Date of Onset of Symptoms: 05/15/2013  Guideline(s) Used:  Abdominal Pain - Male  Disposition Per Guideline:   See Today in Office  Reason For Disposition Reached:   Mild pain that comes and goes (cramps) lasts > 24 hours  Advice Given:  N/A  Patient Refused Recommendation:  Patient Will Make Own Appointment  Appt scheduled on 4-7, Pt will call back on 4-7 to see if PCP has any availbility.

## 2013-05-23 ENCOUNTER — Ambulatory Visit: Payer: Self-pay | Admitting: Internal Medicine

## 2013-05-23 DIAGNOSIS — Z0289 Encounter for other administrative examinations: Secondary | ICD-10-CM

## 2013-05-23 NOTE — Telephone Encounter (Signed)
Will evaluate at OV 

## 2013-05-25 ENCOUNTER — Ambulatory Visit (INDEPENDENT_AMBULATORY_CARE_PROVIDER_SITE_OTHER): Payer: 59 | Admitting: Family Medicine

## 2013-05-25 ENCOUNTER — Encounter: Payer: Self-pay | Admitting: Family Medicine

## 2013-05-25 VITALS — BP 140/88 | HR 78 | Temp 98.0°F | Wt 312.0 lb

## 2013-05-25 DIAGNOSIS — R198 Other specified symptoms and signs involving the digestive system and abdomen: Secondary | ICD-10-CM

## 2013-05-25 DIAGNOSIS — R109 Unspecified abdominal pain: Secondary | ICD-10-CM

## 2013-05-25 DIAGNOSIS — Z720 Tobacco use: Secondary | ICD-10-CM

## 2013-05-25 DIAGNOSIS — R1030 Lower abdominal pain, unspecified: Secondary | ICD-10-CM

## 2013-05-25 DIAGNOSIS — Q638 Other specified congenital malformations of kidney: Secondary | ICD-10-CM

## 2013-05-25 DIAGNOSIS — Q631 Lobulated, fused and horseshoe kidney: Secondary | ICD-10-CM

## 2013-05-25 DIAGNOSIS — R194 Change in bowel habit: Secondary | ICD-10-CM

## 2013-05-25 DIAGNOSIS — Z789 Other specified health status: Secondary | ICD-10-CM

## 2013-05-25 DIAGNOSIS — F17200 Nicotine dependence, unspecified, uncomplicated: Secondary | ICD-10-CM

## 2013-05-25 LAB — POCT URINALYSIS DIPSTICK
Bilirubin, UA: NEGATIVE
Glucose, UA: NEGATIVE
KETONES UA: NEGATIVE
LEUKOCYTES UA: NEGATIVE
Nitrite, UA: NEGATIVE
PROTEIN UA: NEGATIVE
Spec Grav, UA: 1.01
Urobilinogen, UA: NEGATIVE
pH, UA: 7

## 2013-05-25 MED ORDER — TAMSULOSIN HCL 0.4 MG PO CAPS: 0.4000 mg | ORAL_CAPSULE | Freq: Every day | ORAL | Status: DC

## 2013-05-25 NOTE — Progress Notes (Signed)
BP 140/88  Pulse 78  Temp(Src) 98 F (36.7 C) (Oral)  Wt 312 lb (141.522 kg)  SpO2 97%   CC: abd pain  Subjective:    Patient ID: Danny Cox, male    DOB: 07/23/1970, 43 y.o.   MRN: [id number]  HPI: Danny Cox is a 43 y.o. male presenting on 05/25/2013 for Abdominal Pain   Intermittent episodes of lower abdominal and pelvic pain R>L .  Describes burning pain.  Now 4d ago started having stabbing flank pain similar to prior kidney stones but nowhere near severity.  Today feeling better but has had intermittent lower abdominal discomfort.    Regularly had BM at 7am.  Over last 2 weeks noticing more infrequent and irregular bowel movements.  No change in volume or firmness, just slightly more loose.  Denies blood in stool, diarrhea or constipation.  Hasn't changed diet.  Drinking water well.  No fmhx colon cancer.  Possible decreased leg strength. No nausea/vomiting, fevers/chills, hematuria.  No dysuria.  No abd surgeries in the past. Appendix remains.  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  Evaluated 02/2013 with similar sxs and boggy prostate so treated for presumed prostatitis with 4wk course cipro.  Actually pt did not take cipro 2/2 concern for tendon pain.    Relevant past medical, surgical, family and social history reviewed and updated as indicated.  Allergies and medications reviewed and updated. Current Outpatient Prescriptions on File Prior to Visit  Medication Sig  . mometasone (NASONEX) 50 MCG/ACT nasal spray USE 2 SPRAYS IN EACH NOSTRIL EVERY DAY   No current facility-administered medications on file prior to visit.    Review of Systems Per HPI unless specifically indicated above    Objective:    BP 140/88  Pulse 78  Temp(Src) 98 F (36.7 C) (Oral)  Wt 312 lb (141.522 kg)  SpO2 97%  Physical Exam  Nursing note and vitals reviewed. Constitutional: He appears well-developed and  well-nourished. No distress.  Abdominal: Soft. Normal appearance and bowel sounds are normal. He exhibits no distension and no mass. There is no hepatosplenomegaly. There is tenderness (mild to deep palpation) in the right upper quadrant, right lower quadrant, epigastric area and periumbilical area. There is no rigidity, no rebound, no guarding, no CVA tenderness and negative Murphy's sign. Hernia confirmed negative in the right inguinal area and confirmed negative in the left inguinal area.  Genitourinary: Testes normal and penis normal. Right testis shows no mass, no swelling and no tenderness. Right testis is descended. Left testis shows no mass, no swelling and no tenderness. Left testis is descended.  Lymphadenopathy:       Right: No inguinal adenopathy present.       Left: No inguinal adenopathy present.       Assessment & Plan:   Problem List Items Addressed This Visit   Tobacco dipper     Congratulated on quitting, encouraged abstinence.    Lower abdominal pain - Primary     Anticipate kidney stone related - will check UA and sent home with flomax (discussed caution with flomax in h/o sulfa allergy). Also will check abd [place] given h/o horseshoe kidney and h/o hydronephrosis. Will also send home with iFOB given recently endorsed bowel change.    Relevant Medications      tamsulosin (FLOMAX) capsule 0.4 mg   Other Relevant Orders      US Abdomen Complete   Horseshoe kidney  Relevant Orders      US Abdomen Complete    Other Visit Diagnoses   Bowel habit changes        Relevant Orders       Fecal occult blood, imunochemical        Follow up plan: Return if symptoms worsen or fail to improve.

## 2013-05-25 NOTE — Assessment & Plan Note (Signed)
Congratulated on quitting, encouraged abstinence.

## 2013-05-25 NOTE — Progress Notes (Signed)
Pre visit review using our clinic review tool, if applicable. No additional management support is needed unless otherwise documented below in the visit note. 

## 2013-05-25 NOTE — Addendum Note (Signed)
Addended by: Annamarie MajorFUQUAY, LUGENE S on: 05/25/2013 06:03 PM   Modules accepted: Orders

## 2013-05-25 NOTE — Assessment & Plan Note (Addendum)
Anticipate kidney stone related - will check UA and sent home with flomax (discussed caution with flomax in h/o sulfa allergy). Also will check abd US given h/o horseshoe kidney and h/o hydronephrosis. Will also send home with iFOB given recently endorsed bowel change.

## 2013-05-25 NOTE — Patient Instructions (Signed)
Let's check abdominal ultrasound to check on kidneys. Pass by lab for a stool kit to check for hidden blood I wonder about kidney stone causing pain.  Continue to push fluids and start flomax for next 10 days (sent to pharmacy). May use ibuprofen or aleve.

## 2013-05-30 ENCOUNTER — Ambulatory Visit: Payer: Self-pay | Admitting: Family Medicine

## 2013-05-31 ENCOUNTER — Encounter: Payer: Self-pay | Admitting: Family Medicine

## 2013-07-25 ENCOUNTER — Other Ambulatory Visit: Payer: 59

## 2013-07-25 DIAGNOSIS — R194 Change in bowel habit: Secondary | ICD-10-CM

## 2013-07-25 LAB — FECAL OCCULT BLOOD, IMMUNOCHEMICAL: Fecal Occult Bld: NEGATIVE

## 2013-07-25 LAB — FECAL OCCULT BLOOD, GUAIAC: FECAL OCCULT BLD: NEGATIVE

## 2013-07-26 ENCOUNTER — Encounter: Payer: Self-pay | Admitting: *Deleted

## 2013-12-14 ENCOUNTER — Emergency Department: Payer: Self-pay | Admitting: Emergency Medicine

## 2013-12-14 ENCOUNTER — Telehealth: Payer: Self-pay

## 2013-12-14 LAB — COMPREHENSIVE METABOLIC PANEL
ALK PHOS: 59 U/L
Albumin: 4.2 g/dL (ref 3.4–5.0)
Anion Gap: 9 (ref 7–16)
BUN: 17 mg/dL (ref 7–18)
Bilirubin,Total: 0.6 mg/dL (ref 0.2–1.0)
CO2: 26 mmol/L (ref 21–32)
CREATININE: 0.91 mg/dL (ref 0.60–1.30)
Calcium, Total: 9.2 mg/dL (ref 8.5–10.1)
Chloride: 106 mmol/L (ref 98–107)
EGFR (African American): >60
EGFR (Non-African Amer.): >60
Glucose: 104 mg/dL — ABNORMAL HIGH (ref 65–99)
Osmolality: 283 (ref 275–301)
POTASSIUM: 4 mmol/L (ref 3.5–5.1)
SGOT(AST): 25 U/L (ref 15–37)
SGPT (ALT): 58 U/L
Sodium: 141 mmol/L (ref 136–145)
Total Protein: 8.2 g/dL (ref 6.4–8.2)

## 2013-12-14 LAB — URINALYSIS, COMPLETE
BACTERIA: NONE SEEN
BILIRUBIN, UR: NEGATIVE
Glucose,UR: NEGATIVE mg/dL (ref 0–75)
LEUKOCYTE ESTERASE: NEGATIVE
Nitrite: NEGATIVE
Ph: 5 (ref 4.5–8.0)
Protein: NEGATIVE
RBC,UR: 4 /HPF (ref 0–5)
SQUAMOUS EPITHELIAL: NONE SEEN
Specific Gravity: 1.025 (ref 1.003–1.030)
WBC UR: <1 /HPF (ref 0–5)

## 2013-12-14 LAB — CBC
HCT: 46.4 % (ref 40.0–52.0)
HGB: 15.5 g/dL (ref 13.0–18.0)
MCH: 28.4 pg (ref 26.0–34.0)
MCHC: 33.3 g/dL (ref 32.0–36.0)
MCV: 85 fL (ref 80–100)
Platelet: 241 10*3/uL (ref 150–440)
RBC: 5.43 10*6/uL (ref 4.40–5.90)
RDW: 12.8 % (ref 11.5–14.5)
WBC: 9.7 10*3/uL (ref 3.8–10.6)

## 2013-12-14 NOTE — Telephone Encounter (Signed)
Routed to PCP 

## 2013-12-14 NOTE — Telephone Encounter (Signed)
Amy with [facility] said 12/10/13 pt started with lt sided back pain;pain has increased to severe with nausea today. Hx of kidney stones and pt states feels like a kidney stone. [facility] has ED disposition. Dr Reece AgarG [facility] and do not cancel appt with Dr Para Marchuncan on 12/15/13 in case [facility] wants pt to f/u with our office before weekend. If pt does not need appt with Dr Para Marchuncan then pt can cb and cancel.  Amy voiced understanding and will advise pt.

## 2013-12-14 NOTE — Telephone Encounter (Signed)
Noted. Agree.

## 2013-12-14 NOTE — Telephone Encounter (Signed)
Patient Information:  Caller Name: [name]  Phone: [phone]  Patient: Danny Cox, Danny Cox  Gender: Male  DOB: Jul 04, 1970  Age: [age] Years  PCP: Carlus PavlovGherghe, Cristina ([facility])  Office Follow Up:  Does the office need to follow up with this patient?: No  Instructions For The Office: N/A  RN Note:  Pt is calling with severe left side back pain.  He has made appt with PCP on 10/28.  The onset of the pain was 10/25.  He reports that pain has become worse and is now severe.  Only other sx is nausea.  Pt does have a history of kidney stones. Office contacted and PCP advised ED/UC and follow up with office on 10/30 if needed.  Pt does agree and will go to be seen now.  Symptoms  Reason For Call & Symptoms: left side back pain  Reviewed Health History In EMR: Yes  Reviewed Medications In EMR: Yes  Reviewed Allergies In EMR: Yes  Reviewed Surgeries / Procedures: Yes  Date of Onset of Symptoms: 12/10/2013  Guideline(s) Used:  Back Pain  Disposition Per Guideline:   Go to ED Now (or to Office with PCP Approval)  Reason For Disposition Reached:   Pain radiates into groin, scrotum  Advice Given:  N/A  Patient Will Follow Care Advice:  YES

## 2013-12-15 ENCOUNTER — Ambulatory Visit: Payer: 59 | Admitting: Family Medicine

## 2013-12-20 ENCOUNTER — Encounter: Payer: Self-pay | Admitting: Family Medicine

## 2013-12-20 ENCOUNTER — Ambulatory Visit (INDEPENDENT_AMBULATORY_CARE_PROVIDER_SITE_OTHER): Payer: 59 | Admitting: Family Medicine

## 2013-12-20 VITALS — BP 136/90 | HR 96 | Temp 98.0°F | Wt 322.5 lb

## 2013-12-20 DIAGNOSIS — N200 Calculus of kidney: Secondary | ICD-10-CM

## 2013-12-20 DIAGNOSIS — M546 Pain in thoracic spine: Secondary | ICD-10-CM

## 2013-12-20 LAB — POCT URINALYSIS DIPSTICK
Bilirubin, UA: NEGATIVE
GLUCOSE UA: NEGATIVE
KETONES UA: NEGATIVE
Leukocytes, UA: NEGATIVE
Nitrite, UA: NEGATIVE
Protein, UA: NEGATIVE
RBC UA: NEGATIVE
SPEC GRAV UA: 1.025
Urobilinogen, UA: 0.2
pH, UA: 6

## 2013-12-20 NOTE — Progress Notes (Signed)
Pre visit review using our clinic review tool, if applicable. No additional management support is needed unless otherwise documented below in the visit note. 

## 2013-12-20 NOTE — Patient Instructions (Addendum)
Urine looking ok today - I do think this is combination of kidney stone and lumbar strain.  Should get better each day.  May continue oxycodone and lots of water. If persistent pain or new fever or nausea or urinary symptoms let us know.

## 2013-12-20 NOTE — Progress Notes (Addendum)
BP 136/90 mmHg  Pulse 96  Temp(Src) 98 F (36.7 C) (Oral)  Wt 322 lb 8 oz (146.285 kg)   CC: ?kidney stone  Subjective:    Patient ID: Danny FerrariBryan M Bolon, male    DOB: [birth year], 43 y.o.   MRN: [id number]  HPI: Danny Cox is a 43 y.o. male presenting on 12/20/2013 for Back Pain   [facility] - with blood in urine and elevated BP - had US done - no stones seen, sent home with oxycodone 5/325mg  #25, just taking 1 qam. Has been drinking water. Feels abd discomfort L abdomen as well as L flank. Feels some bloating as well.   H/o L back strain 1 month ago at work so unsure what is back strain and what is kidney stone.  Has chiropractor appt this afternoon. 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  Known horseshoe kidney stable from US 2014 and h/o kidney stones (03/2012 was first kidney stone - then 05/2013 another ([place] ox)). CT scan from 03/2012 reviewed. Avoids sodas, drinks plenty of water.   Wt Readings from Last 3 Encounters:  12/20/13 322 lb 8 oz (146.285 kg)  05/25/13 312 lb (141.522 kg)  03/16/13 313 lb 4 oz (142.089 kg)   Body mass index is 42.56 kg/(m^2).   Relevant past medical, surgical, family and social history reviewed and updated as indicated.  Allergies and medications reviewed and updated. Current Outpatient Prescriptions on File Prior to Visit  Medication Sig  . mometasone (NASONEX) 50 MCG/ACT nasal spray USE 2 SPRAYS IN EACH NOSTRIL EVERY DAY   No current facility-administered medications on file prior to visit.   Past Medical History  Diagnosis Date  . Headache(784.0)     sinus  . Heartburn     tums  . Perennial allergic rhinitis     predominantly congestion  . Irregular heart rhythm   . Tinnitus     right ear  . OSA on CPAP 2011  . Elevated BP     resolved with OSA treatment  . Tobacco dipper quit 2015  . Obesity   . Nephrolithiasis 2014    ca oxalate  . Horseshoe kidney 2014    found incidentally on CT  abd for kidney stone    Past Surgical History  Procedure Laterality Date  . Tonsillectomy and adenoidectomy  1984   Review of Systems Per HPI unless specifically indicated above    Objective:    BP 136/90 mmHg  Pulse 96  Temp(Src) 98 F (36.7 C) (Oral)  Wt 322 lb 8 oz (146.285 kg)  Physical Exam  Constitutional: He appears well-developed and well-nourished. No distress.  HENT:  Mouth/Throat: Oropharynx is clear and moist. No oropharyngeal exudate.  Eyes: Conjunctivae and EOM are normal. Pupils are equal, round, and reactive to light. No scleral icterus.  Cardiovascular: Normal rate, regular rhythm, normal heart sounds and intact distal pulses.   No murmur heard. Pulmonary/Chest: Effort normal and breath sounds normal. No respiratory distress. He has no wheezes. He has no rales.  Abdominal: Soft. Bowel sounds are normal. He exhibits no distension and no mass. There is no hepatosplenomegaly. There is tenderness (mild L abd tenderness). There is CVA tenderness (mild L sided discomfort). There is no rigidity, no rebound, no guarding and negative Murphy's sign.    obese  Musculoskeletal: He exhibits no edema.       Back:  No midline spine tenderness Tender to palpation lower thoracic paraspinous mm on left  Skin: Skin is warm and dry. No rash noted.  Nursing note and vitals reviewed.  Results for orders placed or performed in visit on 12/20/13  POCT Urinalysis Dipstick  Result Value Ref Range   Color, UA Yellow    Clarity, UA Clear    Glucose, UA Negative    Bilirubin, UA Negative    Ketones, UA Negative    Spec Grav, UA 1.025    Blood, UA Negative    pH, UA 6.0    Protein, UA Negative    Urobilinogen, UA 0.2    Nitrite, UA Negative    Leukocytes, UA Negative       Assessment & Plan:   Problem List Items Addressed This Visit    Nephrolithiasis - Primary    Anticipate current mild L abd pain due to residual irritation after kidney stone. [facility]. UA today reassuring - hopefully has already passed latest stone Discussed anticipated course of improvement. Discussed if persistent or recurrent pain would consider CT scan to further eval stone burden, and discussed possible referral to urology. Discussed other red flags to seek urgent care - nausea, fever, dysuria. Did not prescribe flomax today 2/2 sulfa allergy - would consider future trial if needed though.    Relevant Medications      oxyCODONE-acetaminophen (PERCOCET/ROXICET) 5-325 MG per tablet   Other Relevant Orders      POCT Urinalysis Dipstick (Completed)   Left-sided thoracic back pain    Anticipate thoracic strain after over exertion at work. Has upcoming appt with chiro this afternoon.    Relevant Medications      oxyCODONE-acetaminophen (PERCOCET/ROXICET) 5-325 MG per tablet       Follow up plan: Return if symptoms worsen or fail to improve.   ADDENDUM==> received, reviewed records from ER WBC 9.7, Hgb 15.5, plt 241 Glu 104, Cr 0.91, K 4, LFTs WNL UA - 1+ blood, 4 RBC/HPF Renal US - horseshoe kidney without kidney stones or hydronephrosis

## 2013-12-20 NOTE — Assessment & Plan Note (Signed)
Anticipate current mild L abd pain due to residual irritation after kidney stone. Have [facility]. UA today reassuring - hopefully has already passed latest stone Discussed anticipated course of improvement. Discussed if persistent or recurrent pain would consider CT scan to further eval stone burden, and discussed possible referral to urology. Discussed other red flags to seek urgent care - nausea, fever, dysuria. Did not prescribe flomax today 2/2 sulfa allergy - would consider future trial if needed though.

## 2013-12-20 NOTE — Assessment & Plan Note (Signed)
Anticipate thoracic strain after over exertion at work. Has upcoming appt with chiro this afternoon.

## 2013-12-26 ENCOUNTER — Telehealth: Payer: Self-pay

## 2013-12-26 DIAGNOSIS — Q631 Lobulated, fused and horseshoe kidney: Secondary | ICD-10-CM

## 2013-12-26 DIAGNOSIS — N200 Calculus of kidney: Secondary | ICD-10-CM

## 2013-12-26 NOTE — Telephone Encounter (Signed)
CT scan ordered

## 2013-12-26 NOTE — Telephone Encounter (Signed)
Pt left v/m; pt seen 12/20/13 with abd and back pain; pain is no better and pt request CT scan that was discussed at 12/20/13 appt. Please advise.

## 2013-12-29 ENCOUNTER — Ambulatory Visit: Payer: Self-pay | Admitting: Family Medicine

## 2014-01-01 ENCOUNTER — Telehealth: Payer: Self-pay | Admitting: Family Medicine

## 2014-01-01 ENCOUNTER — Encounter: Payer: Self-pay | Admitting: Family Medicine

## 2014-01-01 NOTE — Telephone Encounter (Signed)
Patient is calling to find out the results of CT scan.

## 2014-01-01 NOTE — Telephone Encounter (Signed)
Message left notifying patient.

## 2014-01-01 NOTE — Telephone Encounter (Signed)
See result note.  

## 2014-01-03 ENCOUNTER — Telehealth: Payer: Self-pay | Admitting: *Deleted

## 2014-01-03 NOTE — Telephone Encounter (Signed)
Spoke with patient and he advised that he has had 4 chiropractic visits and the pain is slowly getting better. Do you advise that he continue this or do something else?

## 2014-01-03 NOTE — Telephone Encounter (Signed)
Ok to continue this. 

## 2014-01-03 NOTE — Telephone Encounter (Signed)
Patient aware.

## 2014-07-10 ENCOUNTER — Ambulatory Visit (INDEPENDENT_AMBULATORY_CARE_PROVIDER_SITE_OTHER): Payer: 59 | Admitting: Family Medicine

## 2014-07-10 ENCOUNTER — Encounter: Payer: Self-pay | Admitting: Family Medicine

## 2014-07-10 VITALS — BP 130/84 | HR 69 | Temp 97.8°F | Ht 73.0 in | Wt 311.5 lb

## 2014-07-10 DIAGNOSIS — R208 Other disturbances of skin sensation: Secondary | ICD-10-CM

## 2014-07-10 DIAGNOSIS — Z9989 Dependence on other enabling machines and devices: Secondary | ICD-10-CM

## 2014-07-10 DIAGNOSIS — G4733 Obstructive sleep apnea (adult) (pediatric): Secondary | ICD-10-CM | POA: Diagnosis not present

## 2014-07-10 DIAGNOSIS — Z0000 Encounter for general adult medical examination without abnormal findings: Secondary | ICD-10-CM

## 2014-07-10 DIAGNOSIS — R200 Anesthesia of skin: Secondary | ICD-10-CM | POA: Insufficient documentation

## 2014-07-10 DIAGNOSIS — R0789 Other chest pain: Secondary | ICD-10-CM | POA: Diagnosis not present

## 2014-07-10 DIAGNOSIS — Z23 Encounter for immunization: Secondary | ICD-10-CM | POA: Diagnosis not present

## 2014-07-10 LAB — TSH: TSH: 0.63 u[IU]/mL (ref 0.35–4.50)

## 2014-07-10 LAB — COMPREHENSIVE METABOLIC PANEL
ALT: 23 U/L (ref 0–53)
AST: 21 U/L (ref 0–37)
Albumin: 4.4 g/dL (ref 3.5–5.2)
Alkaline Phosphatase: 44 U/L (ref 39–117)
BUN: 14 mg/dL (ref 6–23)
CO2: 25 meq/L (ref 19–32)
CREATININE: 0.83 mg/dL (ref 0.40–1.50)
Calcium: 9.5 mg/dL (ref 8.4–10.5)
Chloride: 103 mEq/L (ref 96–112)
GFR: 107.98 mL/min (ref >60.00)
GLUCOSE: 93 mg/dL (ref 70–99)
Potassium: 3.8 mEq/L (ref 3.5–5.1)
Sodium: 134 mEq/L — ABNORMAL LOW (ref 135–145)
TOTAL PROTEIN: 7.6 g/dL (ref 6.0–8.3)
Total Bilirubin: 0.7 mg/dL (ref 0.2–1.2)

## 2014-07-10 LAB — VITAMIN B12: VITAMIN B 12: 451 pg/mL (ref 211–911)

## 2014-07-10 LAB — CBC WITH DIFFERENTIAL/PLATELET
Basophils Absolute: 0 10*3/uL (ref 0.0–0.1)
Basophils Relative: 0.5 % (ref 0.0–3.0)
Eosinophils Absolute: 0.3 10*3/uL (ref 0.0–0.7)
Eosinophils Relative: 4.5 % (ref 0.0–5.0)
HEMATOCRIT: 43.6 % (ref 39.0–52.0)
Hemoglobin: 14.9 g/dL (ref 13.0–17.0)
Lymphocytes Relative: 36.9 % (ref 12.0–46.0)
Lymphs Abs: 2.6 10*3/uL (ref 0.7–4.0)
MCHC: 34.2 g/dL (ref 30.0–36.0)
MCV: 83.9 fl (ref 78.0–100.0)
MONO ABS: 0.5 10*3/uL (ref 0.1–1.0)
MONOS PCT: 7.3 % (ref 3.0–12.0)
NEUTROS ABS: 3.5 10*3/uL (ref 1.4–7.7)
Neutrophils Relative %: 50.8 % (ref 43.0–77.0)
Platelets: 247 10*3/uL (ref 150.0–400.0)
RBC: 5.19 Mil/uL (ref 4.22–5.81)
RDW: 13.3 % (ref 11.5–15.5)
WBC: 6.9 10*3/uL (ref 4.0–10.5)

## 2014-07-10 LAB — LIPID PANEL
Cholesterol: 222 mg/dL — ABNORMAL HIGH (ref 0–200)
HDL: 41.5 mg/dL (ref >39.00)
LDL Cholesterol: 150 mg/dL — ABNORMAL HIGH (ref 0–99)
NONHDL: 180.5
TRIGLYCERIDES: 152 mg/dL — AB (ref 0.0–149.0)
Total CHOL/HDL Ratio: 5
VLDL: 30.4 mg/dL (ref 0.0–40.0)

## 2014-07-10 LAB — FOLATE: FOLATE: 8.2 ng/mL (ref >5.9)

## 2014-07-10 MED ORDER — MOMETASONE FUROATE 50 MCG/ACT NA SUSP: NASAL | Status: DC

## 2014-07-10 NOTE — Assessment & Plan Note (Signed)
Preventative protocols reviewed and updated unless pt declined. Discussed healthy diet and lifestyle.  

## 2014-07-10 NOTE — Assessment & Plan Note (Signed)
Continue CPAP.  

## 2014-07-10 NOTE — Assessment & Plan Note (Signed)
Discussed healthy diet and lifestyle changes to affect sustainable weight loss  

## 2014-07-10 NOTE — Progress Notes (Signed)
BP 130/84 mmHg  Pulse 69  Temp(Src) 97.8 F (36.6 C) (Oral)  Ht  (1.854 m)  Wt 311 lb 8 oz (141.295 kg)  BMI 41.11 kg/m2  SpO2 96%   CC: CPE, converted to acute visit for chest pain/numbness  Subjective:    Patient ID: Danny Cox, male    DOB: Mar 04, 1970, 44 y.o.   MRN: [id number]  HPI: Danny Cox is a 44 y.o. male presenting on 07/10/2014 for Annual Exam; Chest Pain; and Numbness   Known GERD treated with tums, OSA on CPAP since 2011, obesity. Smokeless tobacco user. No fmhx CAD.   1 mo h/o sharp burning stinging "like bee sting" pain only present when laying down, present L lateral to sternum. This lasts 8-10 seconds and then goes away. Also endorses dull L axillary pain. This doesn't feel like normal heartburn - GERD has improved with decreased dipping. Hasn't tried anything for this yet. Occasional dyspnea.   No chest pain when working - strenuous job.  H/o irregular heart beat - does drink significant caffeine - 3 cups coffee/day, occasional red bull.   Also with 1 mo h/o bilateral hand numbness distal to wrists when he wakes up associated with deep itching. + paresthesias. No pain or weakness. No problems during the day. Occasional neck pain but no radiculopathy. States CTS is common in his line of work.   Preventative: Declines flu  Tetanus - 2006, Tdap today Seat belt use discussed - 95% time, rec 100%. Sunscreen use discussed.  Caffeine: 2 cups coffee/day, 1 red bull per week  Lives with wife, 1 cat and 1 dog  Occupation: Midwife for [employer]  Edu: college  Activity: no regular exercise  Diet: good water, fruits/vegetables daily, stopped fast food  Relevant past medical, surgical, family and social history reviewed and updated as indicated. Interim medical history since our last visit reviewed. Allergies and medications reviewed and updated. No current outpatient prescriptions on file prior to visit.   No current  facility-administered medications on file prior to visit.    Review of Systems  Constitutional: Negative for fever, chills, activity change, appetite change, fatigue and unexpected weight change.  HENT: Negative for hearing loss.   Eyes: Negative for visual disturbance.  Respiratory: Positive for shortness of breath (occasional). Negative for cough, chest tightness and wheezing.   Cardiovascular: Positive for chest pain (see HPI). Negative for palpitations and leg swelling.  Gastrointestinal: Negative for nausea, vomiting, abdominal pain, diarrhea, constipation, blood in stool and abdominal distention.  Genitourinary: Negative for hematuria and difficulty urinating.  Musculoskeletal: Negative for myalgias, arthralgias and neck pain.  Skin: Negative for rash.  Neurological: Negative for dizziness, seizures, syncope and headaches.  Hematological: Negative for adenopathy. Does not bruise/bleed easily.  Psychiatric/Behavioral: Negative for dysphoric mood. The patient is not nervous/anxious.    Per HPI unless specifically indicated above     Objective:    BP 130/84 mmHg  Pulse 69  Temp(Src) 97.8 F (36.6 C) (Oral)  Ht  (1.854 m)  Wt 311 lb 8 oz (141.295 kg)  BMI 41.11 kg/m2  SpO2 96%  Wt Readings from Last 3 Encounters:  07/10/14 311 lb 8 oz (141.295 kg)  12/20/13 322 lb 8 oz (146.285 kg)  05/25/13 312 lb (141.522 kg)    Physical Exam  Constitutional: He is oriented to person, place, and time. He appears well-developed and well-nourished. No distress.  HENT:  Head: Normocephalic and atraumatic.  Right Ear: Hearing, tympanic membrane, external ear  and ear canal normal.  Left Ear: Hearing, tympanic membrane, external ear and ear canal normal.  Nose: Nose normal.  Mouth/Throat: Uvula is midline, oropharynx is clear and moist and mucous membranes are normal. No oropharyngeal exudate, posterior oropharyngeal edema or posterior oropharyngeal erythema.  Eyes: Conjunctivae and EOM  are normal. Pupils are equal, round, and reactive to light. No scleral icterus.  Neck: Normal range of motion. Neck supple. No thyromegaly present.  Cardiovascular: Normal rate, regular rhythm, normal heart sounds and intact distal pulses.   No murmur heard. Pulses:      Radial pulses are 2+ on the right side, and 2+ on the left side.  Pulmonary/Chest: Effort normal and breath sounds normal. No respiratory distress. He has no wheezes. He has no rales.  Abdominal: Soft. Bowel sounds are normal. He exhibits no distension and no mass. There is no tenderness. There is no rebound and no guarding.  Musculoskeletal: Normal range of motion. He exhibits no edema.  Lymphadenopathy:    He has no cervical adenopathy.  Neurological: He is alert and oriented to person, place, and time.  CN grossly intact, station and gait intact Neg tinel/phalen Grip strength intact 5/5 bilaterally Sensation to light touch intact Slightly diminished sensation to temperature bilateral medial hands.  Skin: Skin is warm and dry. No rash noted.  Psychiatric: He has a normal mood and affect. His behavior is normal. Judgment and thought content normal.  Nursing note and vitals reviewed.  Results for orders placed or performed in visit on 07/10/14  Vitamin B12  Result Value Ref Range   Vitamin B-12 451 211 - 911 pg/mL  Folate  Result Value Ref Range   Folate 8.2 >5.9 ng/mL  Lipid panel  Result Value Ref Range   Cholesterol 222 (H) 0 - 200 mg/dL   Triglycerides 409.8 (H) 0.0 - 149.0 mg/dL   HDL 11.91 >47.82 mg/dL   VLDL 95.6 0.0 - 21.3 mg/dL   LDL Cholesterol 086 (H) 0 - 99 mg/dL   Total CHOL/HDL Ratio 5    NonHDL 180.50   Comprehensive metabolic panel  Result Value Ref Range   Sodium 134 (L) 135 - 145 mEq/L   Potassium 3.8 3.5 - 5.1 mEq/L   Chloride 103 96 - 112 mEq/L   CO2 25 19 - 32 mEq/L   Glucose, Bld 93 70 - 99 mg/dL   BUN 14 6 - 23 mg/dL   Creatinine, Ser 5.78 0.40 - 1.50 mg/dL   Total Bilirubin 0.7  0.2 - 1.2 mg/dL   Alkaline Phosphatase 44 39 - 117 U/L   AST 21 0 - 37 U/L   ALT 23 0 - 53 U/L   Total Protein 7.6 6.0 - 8.3 g/dL   Albumin 4.4 3.5 - 5.2 g/dL   Calcium 9.5 8.4 - 46.9 mg/dL   GFR 629.52 >84.13 mL/min  TSH  Result Value Ref Range   TSH 0.63 0.35 - 4.50 uIU/mL  CBC with Differential/Platelet  Result Value Ref Range   WBC 6.9 4.0 - 10.5 K/uL   RBC 5.19 4.22 - 5.81 Mil/uL   Hemoglobin 14.9 13.0 - 17.0 g/dL   HCT 24.4 01.0 - 27.2 %   MCV 83.9 78.0 - 100.0 fl   MCHC 34.2 30.0 - 36.0 g/dL   RDW 53.6 64.4 - 03.4 %   Platelets 247.0 150.0 - 400.0 K/uL   Neutrophils Relative % 50.8 43.0 - 77.0 %   Lymphocytes Relative 36.9 12.0 - 46.0 %   Monocytes Relative 7.3  3.0 - 12.0 %   Eosinophils Relative 4.5 0.0 - 5.0 %   Basophils Relative 0.5 0.0 - 3.0 %   Neutro Abs 3.5 1.4 - 7.7 K/uL   Lymphs Abs 2.6 0.7 - 4.0 K/uL   Monocytes Absolute 0.5 0.1 - 1.0 K/uL   Eosinophils Absolute 0.3 0.0 - 0.7 K/uL   Basophils Absolute 0.0 0.0 - 0.1 K/uL      Assessment & Plan:   Problem List Items Addressed This Visit    Atypical chest pain    Overall chest pain description noncardiac in nature. ?neuralgia given description of pain vs heartburn related. Will treat with trial omeprazole 40mg  daily for next 3 wks.  I asked pt to update me if no improvement noted - would consider gabapentin trial as well. Pt agrees with plan  EKG - NSR rate 60, normal axis, intervals, no acute ST/T changes      Relevant Orders   EKG 12-Lead (Completed)   TSH (Completed)   CBC with Differential/Platelet (Completed)   Bilateral hand numbness    ?CTS related although exam overall reassuring. Will check B12, folate today along with TSH. rec ibuprofen nightly, trial wrist brace nightly. Update if worsening or persistent sxs for NCS and further eval. Pt agrees with plan.      Relevant Orders   Vitamin B12 (Completed)   Folate (Completed)   TSH (Completed)   Healthcare maintenance - Primary     Preventative protocols reviewed and updated unless pt declined. Discussed healthy diet and lifestyle.       Relevant Orders   Tdap vaccine greater than or equal to 7yo IM (Completed)   OSA on CPAP    Continue CPAP.      Severe obesity (BMI >= 40)    Discussed healthy diet and lifestyle changes to affect sustainable weight loss.      Relevant Orders   Lipid panel (Completed)   Comprehensive metabolic panel (Completed)    Other Visit Diagnoses    Need for Tdap vaccination        Relevant Orders    Tdap vaccine greater than or equal to 7yo IM (Completed)        Follow up plan: Return in about 1 year (around 07/10/2015), or as needed, for annual exam, prior fasting for blood work.

## 2014-07-10 NOTE — Patient Instructions (Addendum)
Tdap booster today. labwork today. For hand numbness - could try ibuprofen or sleeping with wrist brace. If not improving with this, let us know for further evaluation - consider nerve conduction studies.  For chest discomfort - doesn't sound cardiac. EKG today. Trial of omeprazole 40mg  daily for 3 weeks to see if this resolves symptoms. If not resolved, let us know for further evaluation. Incorporate regular exercise into routine. Return as needed or in 1 year for next physical.

## 2014-07-10 NOTE — Assessment & Plan Note (Signed)
?  CTS related although exam overall reassuring. Will check B12, folate today along with TSH. rec ibuprofen nightly, trial wrist brace nightly. Update if worsening or persistent sxs for NCS and further eval. Pt agrees with plan.

## 2014-07-10 NOTE — Progress Notes (Signed)
Pre visit review using our clinic review tool, if applicable. No additional management support is needed unless otherwise documented below in the visit note. 

## 2014-07-10 NOTE — Assessment & Plan Note (Addendum)
Overall chest pain description noncardiac in nature. ?neuralgia given description of pain vs heartburn related. Will treat with trial omeprazole 40mg  daily for next 3 wks.  I asked pt to update me if no improvement noted - would consider gabapentin trial as well. Pt agrees with plan   EKG - NSR rate 60, normal axis, intervals, no acute ST/T changes

## 2016-03-06 DIAGNOSIS — G4733 Obstructive sleep apnea (adult) (pediatric): Secondary | ICD-10-CM | POA: Diagnosis not present

## 2016-04-08 DIAGNOSIS — M25551 Pain in right hip: Secondary | ICD-10-CM | POA: Diagnosis not present

## 2016-04-08 DIAGNOSIS — M9903 Segmental and somatic dysfunction of lumbar region: Secondary | ICD-10-CM | POA: Diagnosis not present

## 2016-04-08 DIAGNOSIS — M545 Low back pain: Secondary | ICD-10-CM | POA: Diagnosis not present

## 2016-04-29 DIAGNOSIS — M545 Low back pain: Secondary | ICD-10-CM | POA: Diagnosis not present

## 2016-04-29 DIAGNOSIS — M25551 Pain in right hip: Secondary | ICD-10-CM | POA: Diagnosis not present

## 2016-04-29 DIAGNOSIS — M9903 Segmental and somatic dysfunction of lumbar region: Secondary | ICD-10-CM | POA: Diagnosis not present

## 2016-05-25 DIAGNOSIS — M25551 Pain in right hip: Secondary | ICD-10-CM | POA: Diagnosis not present

## 2016-05-25 DIAGNOSIS — M9903 Segmental and somatic dysfunction of lumbar region: Secondary | ICD-10-CM | POA: Diagnosis not present

## 2016-05-25 DIAGNOSIS — M545 Low back pain: Secondary | ICD-10-CM | POA: Diagnosis not present

## 2016-06-01 DIAGNOSIS — M25551 Pain in right hip: Secondary | ICD-10-CM | POA: Diagnosis not present

## 2016-06-01 DIAGNOSIS — M545 Low back pain: Secondary | ICD-10-CM | POA: Diagnosis not present

## 2016-06-01 DIAGNOSIS — M9903 Segmental and somatic dysfunction of lumbar region: Secondary | ICD-10-CM | POA: Diagnosis not present

## 2016-06-05 DIAGNOSIS — G4733 Obstructive sleep apnea (adult) (pediatric): Secondary | ICD-10-CM | POA: Diagnosis not present

## 2016-07-04 ENCOUNTER — Other Ambulatory Visit: Payer: Self-pay | Admitting: Family Medicine

## 2016-07-04 DIAGNOSIS — E785 Hyperlipidemia, unspecified: Secondary | ICD-10-CM | POA: Insufficient documentation

## 2016-07-06 ENCOUNTER — Other Ambulatory Visit (INDEPENDENT_AMBULATORY_CARE_PROVIDER_SITE_OTHER): Payer: 59

## 2016-07-06 DIAGNOSIS — E785 Hyperlipidemia, unspecified: Secondary | ICD-10-CM | POA: Diagnosis not present

## 2016-07-06 LAB — BASIC METABOLIC PANEL
BUN: 17 mg/dL (ref 6–23)
CHLORIDE: 101 meq/L (ref 96–112)
CO2: 27 meq/L (ref 19–32)
Calcium: 9.7 mg/dL (ref 8.4–10.5)
Creatinine, Ser: 1.01 mg/dL (ref 0.40–1.50)
GFR: 85.29 mL/min (ref >60.00)
GLUCOSE: 93 mg/dL (ref 70–99)
POTASSIUM: 4.2 meq/L (ref 3.5–5.1)
Sodium: 135 mEq/L (ref 135–145)

## 2016-07-06 LAB — LIPID PANEL
CHOLESTEROL: 215 mg/dL — AB (ref 0–200)
HDL: 36.5 mg/dL — ABNORMAL LOW (ref >39.00)
NonHDL: 178.66
Total CHOL/HDL Ratio: 6
Triglycerides: 307 mg/dL — ABNORMAL HIGH (ref 0.0–149.0)
VLDL: 61.4 mg/dL — ABNORMAL HIGH (ref 0.0–40.0)

## 2016-07-06 LAB — LDL CHOLESTEROL, DIRECT: Direct LDL: 127 mg/dL

## 2016-07-07 ENCOUNTER — Encounter: Payer: Self-pay | Admitting: Family Medicine

## 2016-07-07 ENCOUNTER — Ambulatory Visit (INDEPENDENT_AMBULATORY_CARE_PROVIDER_SITE_OTHER): Payer: 59 | Admitting: Family Medicine

## 2016-07-07 VITALS — BP 122/80 | HR 83 | Temp 98.4°F | Ht 73.0 in | Wt 321.5 lb

## 2016-07-07 DIAGNOSIS — I499 Cardiac arrhythmia, unspecified: Secondary | ICD-10-CM

## 2016-07-07 DIAGNOSIS — Z0000 Encounter for general adult medical examination without abnormal findings: Secondary | ICD-10-CM

## 2016-07-07 DIAGNOSIS — Z9989 Dependence on other enabling machines and devices: Secondary | ICD-10-CM

## 2016-07-07 DIAGNOSIS — E785 Hyperlipidemia, unspecified: Secondary | ICD-10-CM

## 2016-07-07 DIAGNOSIS — Z789 Other specified health status: Secondary | ICD-10-CM | POA: Diagnosis not present

## 2016-07-07 DIAGNOSIS — Z6841 Body Mass Index (BMI) 40.0 and over, adult: Secondary | ICD-10-CM

## 2016-07-07 DIAGNOSIS — Z720 Tobacco use: Secondary | ICD-10-CM

## 2016-07-07 DIAGNOSIS — G4733 Obstructive sleep apnea (adult) (pediatric): Secondary | ICD-10-CM

## 2016-07-07 NOTE — Assessment & Plan Note (Signed)
Reviewed FLP with patient. Discussed healthy diet changes to improve triglycerides.  ASCVD 10 yr risk = 2.9%

## 2016-07-07 NOTE — Assessment & Plan Note (Signed)
Discussed healthy diet and lifestyle changes to affect sustainable weight loss  

## 2016-07-07 NOTE — Assessment & Plan Note (Signed)
Preventative protocols reviewed and updated unless pt declined. Discussed healthy diet and lifestyle.  

## 2016-07-07 NOTE — Patient Instructions (Addendum)
You are doing well today.  Return as needed or in 1 year for next physical. Work on diet and lifestyle changes to affect sustainable weight loss. Decrease added sugars, eliminate trans fats, increase fiber and limit alcohol.  All these changes together can drop triglycerides by almost 50%.  Return as needed or in 1 year for next physical.  Health Maintenance, Male A healthy lifestyle and preventive care is important for your health and wellness. Ask your health care provider about what schedule of regular examinations is right for you. What should I know about weight and diet?  Eat a Healthy Diet  Eat plenty of vegetables, fruits, whole grains, low-fat dairy products, and lean protein.  Do not eat a lot of foods high in solid fats, added sugars, or salt. Maintain a Healthy Weight  Regular exercise can help you achieve or maintain a healthy weight. You should:  Do at least 150 minutes of exercise each week. The exercise should increase your heart rate and make you sweat (moderate-intensity exercise).  Do strength-training exercises at least twice a week. Watch Your Levels of Cholesterol and Blood Lipids  Have your blood tested for lipids and cholesterol every 5 years starting at 44 years of age. If you are at high risk for heart disease, you should start having your blood tested when you are 44 years old. You may need to have your cholesterol levels checked more often if:  Your lipid or cholesterol levels are high.  You are older than 44 years of age.  You are at high risk for heart disease. What should I know about cancer screening? Many types of cancers can be detected early and may often be prevented. Lung Cancer  You should be screened every year for lung cancer if:  You are a current smoker who has smoked for at least 30 years.  You are a former smoker who has quit within the past 15 years.  Talk to your health care provider about your screening options, when you should  start screening, and how often you should be screened. Colorectal Cancer  Routine colorectal cancer screening usually begins at 44 years of age and should be repeated every 5-10 years until you are 44 years old. You may need to be screened more often if early forms of precancerous polyps or small growths are found. Your health care provider may recommend screening at an earlier age if you have risk factors for colon cancer.  Your health care provider may recommend using home test kits to check for hidden blood in the stool.  A small camera at the end of a tube can be used to examine your colon (sigmoidoscopy or colonoscopy). This checks for the earliest forms of colorectal cancer. Prostate and Testicular Cancer  Depending on your age and overall health, your health care provider may do certain tests to screen for prostate and testicular cancer.  Talk to your health care provider about any symptoms or concerns you have about testicular or prostate cancer. Skin Cancer  Check your skin from head to toe regularly.  Tell your health care provider about any new moles or changes in moles, especially if:  There is a change in a mole's size, shape, or color.  You have a mole that is larger than a pencil eraser.  Always use sunscreen. Apply sunscreen liberally and repeat throughout the day.  Protect yourself by wearing long sleeves, pants, a wide-brimmed hat, and sunglasses when outside. What should I know about heart disease,  diabetes, and high blood pressure?  If you are 44-87 years of age, have your blood pressure checked every 3-5 years. If you are 44 years of age or older, have your blood pressure checked every year. You should have your blood pressure measured twice-once when you are at a hospital or clinic, and once when you are not at a hospital or clinic. Record the average of the two measurements. To check your blood pressure when you are not at a hospital or clinic, you can use:  An  automated blood pressure machine at a pharmacy.  A home blood pressure monitor.  Talk to your health care provider about your target blood pressure.  If you are between 44-88 years old, ask your health care provider if you should take aspirin to prevent heart disease.  Have regular diabetes screenings by checking your fasting blood sugar level.  If you are at a normal weight and have a low risk for diabetes, have this test once every three years after the age of 14.  If you are overweight and have a high risk for diabetes, consider being tested at a younger age or more often.  A one-time screening for abdominal aortic aneurysm (AAA) by ultrasound is recommended for men aged 65-75 years who are current or former smokers. What should I know about preventing infection? Hepatitis B  If you have a higher risk for hepatitis B, you should be screened for this virus. Talk with your health care provider to find out if you are at risk for hepatitis B infection. Hepatitis C  Blood testing is recommended for:  Everyone born from [birth year] through 1963.  Anyone with known risk factors for hepatitis C. Sexually Transmitted Diseases (STDs)  You should be screened each year for STDs including gonorrhea and chlamydia if:  You are sexually active and are younger than 44 years of age.  You are older than 44 years of age and your health care provider tells you that you are at risk for this type of infection.  Your sexual activity has changed since you were last screened and you are at an increased risk for chlamydia or gonorrhea. Ask your health care provider if you are at risk.  Talk with your health care provider about whether you are at high risk of being infected with HIV. Your health care provider may recommend a prescription medicine to help prevent HIV infection. What else can I do?  Schedule regular health, dental, and eye exams.  Stay current with your vaccines (immunizations).  Do not use  any tobacco products, such as cigarettes, chewing tobacco, and e-cigarettes. If you need help quitting, ask your health care provider.  Limit alcohol intake to no more than 2 drinks per day. One drink equals 12 ounces of beer, 5 ounces of wine, or 1 ounces of hard liquor.  Do not use street drugs.  Do not share needles.  Ask your health care provider for help if you need support or information about quitting drugs.  Tell your health care provider if you often feel depressed.  Tell your health care provider if you have ever been abused or do not feel safe at home. This information is not intended to replace advice given to you by your health care provider. Make sure you discuss any questions you have with your health care provider. Document Released: 08/01/2007 Document Revised: 10/02/2015 Document Reviewed: 11/06/2014 Elsevier Interactive Patient Education  2017 [employer].

## 2016-07-07 NOTE — Assessment & Plan Note (Signed)
Continue to encourage cessation. 

## 2016-07-07 NOTE — Progress Notes (Signed)
BP 122/80   Pulse 83   Temp 98.4 F (36.9 C)   Ht 6\' 1"  (1.854 m)   Wt (!) 321 lb 8 oz (145.8 kg)   SpO2 95%   BMI 42.42 kg/m    CC: CPE Subjective:    Patient ID: Danny Cox, male    DOB: 1970/05/12, 46 y.o.   MRN: [id number]  HPI: Danny Cox is a 46 y.o. male presenting on 07/07/2016 for Annual Exam   Intermittent RUQ discomfort worse with laying down - now improved. Describes sharp burning pain. No rash.   OSA compliant with CPAP nightly.   Preventative: Declines flu shot Tdap 2016 Seat belt use discussed Sunscreen use discussed. No changing moles on skin Non smoker - smokeless tobacco 1 can/day.  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 Interim medical history since our last visit reviewed. Allergies and medications reviewed and updated. Outpatient Medications Prior to Visit  Medication Sig Dispense Refill  . mometasone (NASONEX) 50 MCG/ACT nasal spray USE 2 SPRAYS IN EACH NOSTRIL EVERY DAY 17 g 3   No facility-administered medications prior to visit.      Per HPI unless specifically indicated in ROS section below Review of Systems  Constitutional: Negative for activity change, appetite change, chills, fatigue, fever and unexpected weight change.  HENT: Negative for hearing loss.   Eyes: Negative for visual disturbance.  Respiratory: Negative for cough, chest tightness, shortness of breath and wheezing.   Cardiovascular: Negative for chest pain, palpitations and leg swelling.  Gastrointestinal: Positive for abdominal pain (RUQ). Negative for abdominal distention, blood in stool, constipation, diarrhea, nausea and vomiting.  Genitourinary:  Negative for difficulty urinating and hematuria.  Musculoskeletal: Negative for arthralgias, myalgias and neck pain.  Skin: Negative for rash.  Neurological: Negative for dizziness, seizures, syncope and headaches.  Hematological: Negative for adenopathy. Does not bruise/bleed easily.  Psychiatric/Behavioral: Negative for dysphoric mood. The patient is not nervous/anxious.        Objective:    BP 122/80   Pulse 83   Temp 98.4 F (36.9 C)   Ht 6\' 1"  (1.854 m)   Wt (!) 321 lb 8 oz (145.8 kg)   SpO2 95%   BMI 42.42 kg/m   Wt Readings from Last 3 Encounters:  07/07/16 (!) 321 lb 8 oz (145.8 kg)  07/10/14 (!) 311 lb 8 oz (141.3 kg)  12/20/13 (!) 322 lb 8 oz (146.3 kg)    Physical Exam  Constitutional: He is oriented to person, place, and time. He appears well-developed and well-nourished. No distress.  HENT:  Head: Normocephalic and atraumatic.  Right Ear: Hearing, tympanic membrane, external ear and ear canal normal.  Left Ear: Hearing, tympanic membrane, external ear and ear canal normal.  Nose: Nose normal.  Mouth/Throat: Uvula is midline, oropharynx is clear and moist and mucous membranes are normal. No oropharyngeal exudate, posterior oropharyngeal edema or posterior oropharyngeal erythema.  Eyes: Conjunctivae and EOM are normal. Pupils are equal, round, and reactive to light. No scleral icterus.  Neck: Normal range of motion. Neck supple. No thyromegaly present.  Cardiovascular: Normal rate, regular rhythm, normal heart sounds and intact distal pulses.   No murmur heard.  Pulses:      Radial pulses are 2+ on the right side, and 2+ on the left side.  Pulmonary/Chest: Effort normal and breath sounds normal. No respiratory distress. He has no wheezes. He has no rales.  Abdominal: Soft. Bowel sounds are normal. He exhibits no distension and no mass. There is no tenderness. There is no rebound and no guarding.  Musculoskeletal: Normal range of motion. He exhibits no edema.    Lymphadenopathy:    He has no cervical adenopathy.  Neurological: He is alert and oriented to person, place, and time.  CN grossly intact, station and gait intact  Skin: Skin is warm and dry. No rash noted.  Psychiatric: He has a normal mood and affect. His behavior is normal. Judgment and thought content normal.  Nursing note and vitals reviewed.  Results for orders placed or performed in visit on 07/06/16  Lipid panel  Result Value Ref Range   Cholesterol 215 (H) 0 - 200 mg/dL   Triglycerides 161.0 (H) 0.0 - 149.0 mg/dL   HDL 96.04 (L) >54.09 mg/dL   VLDL 81.1 (H) 0.0 - 91.4 mg/dL   Total CHOL/HDL Ratio 6    NonHDL 178.66   Basic metabolic panel  Result Value Ref Range   Sodium 135 135 - 145 mEq/L   Potassium 4.2 3.5 - 5.1 mEq/L   Chloride 101 96 - 112 mEq/L   CO2 27 19 - 32 mEq/L   Glucose, Bld 93 70 - 99 mg/dL   BUN 17 6 - 23 mg/dL   Creatinine, Ser 7.82 0.40 - 1.50 mg/dL   Calcium 9.7 8.4 - 95.6 mg/dL   GFR 21.30 >86.57 mL/min  LDL cholesterol, direct  Result Value Ref Range   Direct LDL 127.0 mg/dL   Lab Results  Component Value Date   TSH 0.63 07/10/2014       Assessment & Plan:   Problem List Items Addressed This Visit    Body mass index (BMI) 40.0-44.9, adult (HCC)    Discussed healthy diet and lifestyle changes to affect sustainable weight loss.       Dyslipidemia    Reviewed FLP with patient. Discussed healthy diet changes to improve triglycerides.  ASCVD 10 yr risk = 2.9%      Healthcare maintenance - Primary    Preventative protocols reviewed and updated unless pt declined. Discussed healthy diet and lifestyle.       RESOLVED: Irregular heart rhythm    Improved with less energy drinks.      OSA on CPAP    Chronic, stable on CPAP with good compliance per report. Feels better sleep with machine use.       Tobacco dipper    Continue to encourage cessation.          Follow up plan: Return in about 1 year (around 07/07/2017) for annual  exam, prior fasting for blood work.  [name], MD

## 2016-07-07 NOTE — Assessment & Plan Note (Signed)
Improved with less energy drinks.

## 2016-07-07 NOTE — Assessment & Plan Note (Signed)
Chronic, stable on CPAP with good compliance per report. Feels better sleep with machine use.

## 2016-09-04 DIAGNOSIS — G4733 Obstructive sleep apnea (adult) (pediatric): Secondary | ICD-10-CM | POA: Diagnosis not present

## 2016-11-27 DIAGNOSIS — G2581 Restless legs syndrome: Secondary | ICD-10-CM | POA: Diagnosis not present

## 2016-11-27 DIAGNOSIS — G4733 Obstructive sleep apnea (adult) (pediatric): Secondary | ICD-10-CM | POA: Diagnosis not present

## 2016-11-27 DIAGNOSIS — J3089 Other allergic rhinitis: Secondary | ICD-10-CM | POA: Diagnosis not present

## 2016-12-08 DIAGNOSIS — G4733 Obstructive sleep apnea (adult) (pediatric): Secondary | ICD-10-CM | POA: Diagnosis not present

## 2016-12-14 ENCOUNTER — Encounter: Payer: Self-pay | Admitting: Family Medicine

## 2016-12-14 DIAGNOSIS — G2581 Restless legs syndrome: Secondary | ICD-10-CM | POA: Insufficient documentation

## 2017-01-29 DIAGNOSIS — H0015 Chalazion left lower eyelid: Secondary | ICD-10-CM | POA: Diagnosis not present

## 2017-03-10 DIAGNOSIS — G4733 Obstructive sleep apnea (adult) (pediatric): Secondary | ICD-10-CM | POA: Diagnosis not present

## 2017-06-09 DIAGNOSIS — G4733 Obstructive sleep apnea (adult) (pediatric): Secondary | ICD-10-CM | POA: Diagnosis not present

## 2017-07-25 ENCOUNTER — Other Ambulatory Visit: Payer: Self-pay | Admitting: Family Medicine

## 2017-07-25 DIAGNOSIS — E785 Hyperlipidemia, unspecified: Secondary | ICD-10-CM

## 2017-07-25 DIAGNOSIS — Z6841 Body Mass Index (BMI) 40.0 and over, adult: Secondary | ICD-10-CM

## 2017-07-26 ENCOUNTER — Other Ambulatory Visit (INDEPENDENT_AMBULATORY_CARE_PROVIDER_SITE_OTHER): Payer: 59

## 2017-07-26 DIAGNOSIS — E785 Hyperlipidemia, unspecified: Secondary | ICD-10-CM

## 2017-07-26 LAB — COMPREHENSIVE METABOLIC PANEL
ALBUMIN: 4.4 g/dL (ref 3.5–5.2)
ALK PHOS: 48 U/L (ref 39–117)
ALT: 17 U/L (ref 0–53)
AST: 15 U/L (ref 0–37)
BILIRUBIN TOTAL: 0.6 mg/dL (ref 0.2–1.2)
BUN: 14 mg/dL (ref 6–23)
CALCIUM: 9.6 mg/dL (ref 8.4–10.5)
CO2: 28 mEq/L (ref 19–32)
CREATININE: 0.98 mg/dL (ref 0.40–1.50)
Chloride: 103 mEq/L (ref 96–112)
GFR: 87.88 mL/min (ref >60.00)
Glucose, Bld: 122 mg/dL — ABNORMAL HIGH (ref 70–99)
Potassium: 4.4 mEq/L (ref 3.5–5.1)
Sodium: 139 mEq/L (ref 135–145)
Total Protein: 7.6 g/dL (ref 6.0–8.3)

## 2017-07-26 LAB — LIPID PANEL
CHOLESTEROL: 220 mg/dL — AB (ref 0–200)
HDL: 35.5 mg/dL — ABNORMAL LOW (ref >39.00)
LDL Cholesterol: 148 mg/dL — ABNORMAL HIGH (ref 0–99)
NonHDL: 184.22
TRIGLYCERIDES: 183 mg/dL — AB (ref 0.0–149.0)
Total CHOL/HDL Ratio: 6
VLDL: 36.6 mg/dL (ref 0.0–40.0)

## 2017-07-26 LAB — TSH: TSH: 1.17 u[IU]/mL (ref 0.35–4.50)

## 2017-07-28 ENCOUNTER — Ambulatory Visit (INDEPENDENT_AMBULATORY_CARE_PROVIDER_SITE_OTHER): Payer: 59 | Admitting: Family Medicine

## 2017-07-28 ENCOUNTER — Encounter: Payer: Self-pay | Admitting: Family Medicine

## 2017-07-28 VITALS — BP 132/72 | HR 85 | Temp 98.2°F | Ht 72.75 in | Wt 332.5 lb

## 2017-07-28 DIAGNOSIS — E785 Hyperlipidemia, unspecified: Secondary | ICD-10-CM | POA: Diagnosis not present

## 2017-07-28 DIAGNOSIS — Z87891 Personal history of nicotine dependence: Secondary | ICD-10-CM

## 2017-07-28 DIAGNOSIS — J3089 Other allergic rhinitis: Secondary | ICD-10-CM | POA: Diagnosis not present

## 2017-07-28 DIAGNOSIS — Z0000 Encounter for general adult medical examination without abnormal findings: Secondary | ICD-10-CM | POA: Diagnosis not present

## 2017-07-28 DIAGNOSIS — L089 Local infection of the skin and subcutaneous tissue, unspecified: Secondary | ICD-10-CM

## 2017-07-28 DIAGNOSIS — Z6841 Body Mass Index (BMI) 40.0 and over, adult: Secondary | ICD-10-CM

## 2017-07-28 DIAGNOSIS — R221 Localized swelling, mass and lump, neck: Secondary | ICD-10-CM | POA: Insufficient documentation

## 2017-07-28 DIAGNOSIS — R222 Localized swelling, mass and lump, trunk: Secondary | ICD-10-CM | POA: Diagnosis not present

## 2017-07-28 HISTORY — DX: Local infection of the skin and subcutaneous tissue, unspecified: L08.9

## 2017-07-28 MED ORDER — OLOPATADINE HCL 0.2 % OP SOLN: 1.0000 [drp] | Freq: Every day | OPHTHALMIC | 3 refills | Status: DC

## 2017-07-28 MED ORDER — MOMETASONE FUROATE 50 MCG/ACT NA SUSP: 2.0000 | Freq: Every day | NASAL | 6 refills | Status: DC

## 2017-07-28 NOTE — Assessment & Plan Note (Signed)
Left sided - feels benign (not firm or matted or fixed). Anticipate possible lipoma. Monitor for now. If enlarging or bothersome, will start with CXR and consider CT for further eval.

## 2017-07-28 NOTE — Assessment & Plan Note (Signed)
Preventative protocols reviewed and updated unless pt declined. Discussed healthy diet and lifestyle.  

## 2017-07-28 NOTE — Assessment & Plan Note (Addendum)
Quit snuff 2 months ago! Congratulated on quitting.

## 2017-07-28 NOTE — Patient Instructions (Signed)
Let's watch area left upper clavicle - if enlarging or more bothersome, let me know for chest xray.  Work on aerobic exercise for goal weight loss and for healthy heart.  Return as needed or in 1 year for next physical.   Health Maintenance, Male A healthy lifestyle and preventive care is important for your health and wellness. Ask your health care provider about what schedule of regular examinations is right for you. What should I know about weight and diet? Eat a Healthy Diet  Eat plenty of vegetables, fruits, whole grains, low-fat dairy products, and lean protein.  Do not eat a lot of foods high in solid fats, added sugars, or salt.  Maintain a Healthy Weight Regular exercise can help you achieve or maintain a healthy weight. You should:  Do at least 150 minutes of exercise each week. The exercise should increase your heart rate and make you sweat (moderate-intensity exercise).  Do strength-training exercises at least twice a week.  Watch Your Levels of Cholesterol and Blood Lipids  Have your blood tested for lipids and cholesterol every 5 years starting at 45 years of age. If you are at high risk for heart disease, you should start having your blood tested when you are 45 years old. You may need to have your cholesterol levels checked more often if: ? Your lipid or cholesterol levels are high. ? You are older than 45 years of age. ? You are at high risk for heart disease.  What should I know about cancer screening? Many types of cancers can be detected early and may often be prevented. Lung Cancer  You should be screened every year for lung cancer if: ? You are a current smoker who has smoked for at least 30 years. ? You are a former smoker who has quit within the past 15 years.  Talk to your health care provider about your screening options, when you should start screening, and how often you should be screened.  Colorectal Cancer  Routine colorectal cancer screening  usually begins at 45 years of age and should be repeated every 5-10 years until you are 45 years old. You may need to be screened more often if early forms of precancerous polyps or small growths are found. Your health care provider may recommend screening at an earlier age if you have risk factors for colon cancer.  Your health care provider may recommend using home test kits to check for hidden blood in the stool.  A small camera at the end of a tube can be used to examine your colon (sigmoidoscopy or colonoscopy). This checks for the earliest forms of colorectal cancer.  Prostate and Testicular Cancer  Depending on your age and overall health, your health care provider may do certain tests to screen for prostate and testicular cancer.  Talk to your health care provider about any symptoms or concerns you have about testicular or prostate cancer.  Skin Cancer  Check your skin from head to toe regularly.  Tell your health care provider about any new moles or changes in moles, especially if: ? There is a change in a mole's size, shape, or color. ? You have a mole that is larger than a pencil eraser.  Always use sunscreen. Apply sunscreen liberally and repeat throughout the day.  Protect yourself by wearing long sleeves, pants, a wide-brimmed hat, and sunglasses when outside.  What should I know about heart disease, diabetes, and high blood pressure?  If you are 18-39 years of  age, have your blood pressure checked every 3-5 years. If you are 49 years of age or older, have your blood pressure checked every year. You should have your blood pressure measured twice-once when you are at a hospital or clinic, and once when you are not at a hospital or clinic. Record the average of the two measurements. To check your blood pressure when you are not at a hospital or clinic, you can use: ? An automated blood pressure machine at a pharmacy. ? A home blood pressure monitor.  Talk to your health care  provider about your target blood pressure.  If you are between 51-38 years old, ask your health care provider if you should take aspirin to prevent heart disease.  Have regular diabetes screenings by checking your fasting blood sugar level. ? If you are at a normal weight and have a low risk for diabetes, have this test once every three years after the age of 27. ? If you are overweight and have a high risk for diabetes, consider being tested at a younger age or more often.  A one-time screening for abdominal aortic aneurysm (AAA) by ultrasound is recommended for men aged 81-75 years who are current or former smokers. What should I know about preventing infection? Hepatitis B If you have a higher risk for hepatitis B, you should be screened for this virus. Talk with your health care provider to find out if you are at risk for hepatitis B infection. Hepatitis C Blood testing is recommended for:  Everyone born from [birth year] through 1965.  Anyone with known risk factors for hepatitis C.  Sexually Transmitted Diseases (STDs)  You should be screened each year for STDs including gonorrhea and chlamydia if: ? You are sexually active and are younger than 45 years of age. ? You are older than 45 years of age and your health care provider tells you that you are at risk for this type of infection. ? Your sexual activity has changed since you were last screened and you are at an increased risk for chlamydia or gonorrhea. Ask your health care provider if you are at risk.  Talk with your health care provider about whether you are at high risk of being infected with HIV. Your health care provider may recommend a prescription medicine to help prevent HIV infection.  What else can I do?  Schedule regular health, dental, and eye exams.  Stay current with your vaccines (immunizations).  Do not use any tobacco products, such as cigarettes, chewing tobacco, and e-cigarettes. If you need help quitting, ask  your health care provider.  Limit alcohol intake to no more than 2 drinks per day. One drink equals 12 ounces of beer, 5 ounces of wine, or 1 ounces of hard liquor.  Do not use street drugs.  Do not share needles.  Ask your health care provider for help if you need support or information about quitting drugs.  Tell your health care provider if you often feel depressed.  Tell your health care provider if you have ever been abused or do not feel safe at home. This information is not intended to replace advice given to you by your health care provider. Make sure you discuss any questions you have with your health care provider. Document Released: 08/01/2007 Document Revised: 10/02/2015 Document Reviewed: 11/06/2014 Elsevier Interactive Patient Education  [employer].

## 2017-07-28 NOTE — Assessment & Plan Note (Signed)
nasonex refilled. Also using allergy eye drops.

## 2017-07-28 NOTE — Assessment & Plan Note (Addendum)
Chronic, elevated off medication. Non smoker - unsure how to change ASCVD score calculation to reflect this.  The 10-year ASCVD risk score Denman George(Goff DC Montez HagemanJr., et al., 2013) is: 10.1%   Values used to calculate the score:     Age: [age] years     Sex: Male     Is Non-Hispanic African American: No     Diabetic: No     Tobacco smoker: Yes     Systolic Blood Pressure: 132 mmHg     Is BP treated: No     HDL Cholesterol: 35.5 mg/dL     Total Cholesterol: 220 mg/dL

## 2017-07-28 NOTE — Assessment & Plan Note (Signed)
Reviewed weight gain noted. Encouraged implement aerobic exercise into weight training routine.

## 2017-07-28 NOTE — Progress Notes (Signed)
BP 132/72 (BP Location: Left Arm, Patient Position: Sitting, Cuff Size: Large)   Pulse 85   Temp 98.2 F (36.8 C) (Oral)   Ht 6' 0.75" (1.848 m)   Wt (!) 332 lb 8 oz (150.8 kg)   SpO2 96%   BMI 44.17 kg/m    CC: CPE Subjective:    Patient ID: Danny Cox, male    DOB: [birth year], 47 y.o.   MRN: [id number]  HPI: JULIO ZAPPIA is a 47 y.o. male presenting on 07/28/2017 for Annual Exam   Chronic stye L lower eyelid. Watery eyes.  Weight gain - attributes to muscle mass gain. 6 wk h/o L supraclavicular swelling  Preventative: Declines flu shot Td 2006, Tdap 2016 Seat belt use discussed Sunscreen use discussed. No changing moles on skin. Saw derm.  Non smoker - quit dipping 05/2017! Chewing nicotine gum occasionally.  Alcohol - 6 beers a month Dentist - Q6 mo Eye exam - has not seen regularly  Caffeine: 2-3 cups coffee/day Lives with wife, 1 cat and 1 dog  Occupation: Midwife for [employer]  Edu: college  Activity: goes to [employer] 4-5 times weekly Diet: good water, fruits/vegetables some  Relevant past medical, surgical, family and social history reviewed and updated as indicated. Interim medical history since our last visit reviewed. Allergies and medications reviewed and updated. Outpatient Medications Prior to Visit  Medication Sig Dispense Refill  . NON FORMULARY prosta -strong prostate supplement    . mometasone (NASONEX) 50 MCG/ACT nasal spray USE 2 SPRAYS IN EACH NOSTRIL EVERY DAY 17 g 3  . Olopatadine HCl 0.2 % SOLN Place 1 drop into both eyes daily. 1-2 drops in each eye daily    . OVER THE COUNTER MEDICATION Prostate supplement     No facility-administered medications prior to visit.      Per HPI unless specifically indicated in ROS section below Review of Systems  Constitutional: Negative for activity change, appetite change, chills, fatigue, fever and unexpected weight change.  HENT: Negative for hearing loss.   Eyes: Negative for  visual disturbance.  Respiratory: Negative for cough, chest tightness, shortness of breath and wheezing.   Cardiovascular: Negative for chest pain, palpitations and leg swelling.  Gastrointestinal: Negative for abdominal distention, abdominal pain, blood in stool, constipation, diarrhea, nausea and vomiting.  Genitourinary: Negative for difficulty urinating and hematuria.  Musculoskeletal: Negative for arthralgias, myalgias and neck pain.  Skin: Negative for rash.  Neurological: Negative for dizziness, seizures, syncope and headaches.  Hematological: Positive for adenopathy (?L supraclavicular). Does not bruise/bleed easily.  Psychiatric/Behavioral: Negative for dysphoric mood. The patient is not nervous/anxious.        Objective:    BP 132/72 (BP Location: Left Arm, Patient Position: Sitting, Cuff Size: Large)   Pulse 85   Temp 98.2 F (36.8 C) (Oral)   Ht 6' 0.75" (1.848 m)   Wt (!) 332 lb 8 oz (150.8 kg)   SpO2 96%   BMI 44.17 kg/m   Wt Readings from Last 3 Encounters:  07/28/17 (!) 332 lb 8 oz (150.8 kg)  07/07/16 (!) 321 lb 8 oz (145.8 kg)  07/10/14 (!) 311 lb 8 oz (141.3 kg)    Physical Exam  Constitutional: He is oriented to person, place, and time. He appears well-developed and well-nourished. No distress.  HENT:  Head: Normocephalic and atraumatic.  Right Ear: Hearing, tympanic membrane, external ear and ear canal normal.  Left Ear: Hearing, tympanic membrane, external ear and ear canal normal.  Nose: Nose normal.  Mouth/Throat: Uvula is midline, oropharynx is clear and moist and mucous membranes are normal. No oropharyngeal exudate, posterior oropharyngeal edema or posterior oropharyngeal erythema.  Eyes: Pupils are equal, round, and reactive to light. Conjunctivae and EOM are normal. No scleral icterus.  Neck: Normal range of motion. Neck supple. No thyromegaly present.  Cardiovascular: Normal rate, regular rhythm, normal heart sounds and intact distal pulses.  No  murmur heard. Pulses:      Radial pulses are 2+ on the right side, and 2+ on the left side.  Pulmonary/Chest: Effort normal and breath sounds normal. No respiratory distress. He has no wheezes. He has no rales.  Abdominal: Soft. Bowel sounds are normal. He exhibits no distension and no mass. There is no tenderness. There is no rebound and no guarding.  Musculoskeletal: Normal range of motion. He exhibits no edema.  Lymphadenopathy:       Head (right side): No submental, no submandibular, no tonsillar, no preauricular and no posterior auricular adenopathy present.       Head (left side): No submental, no submandibular, no tonsillar, no preauricular and no posterior auricular adenopathy present.    He has no cervical adenopathy.    He has no axillary adenopathy.       Right axillary: No lateral adenopathy present.       Left axillary: No lateral adenopathy present.      Right: No supraclavicular adenopathy present.       Left: No supraclavicular adenopathy present.  Small <1cm nontender mobile lump L supraclavicular region  Neurological: He is alert and oriented to person, place, and time.  CN grossly intact, station and gait intact  Skin: Skin is warm and dry. No rash noted.  Psychiatric: He has a normal mood and affect. His behavior is normal. Judgment and thought content normal.  Nursing note and vitals reviewed.  Results for orders placed or performed in visit on 07/26/17  TSH  Result Value Ref Range   TSH 1.17 0.35 - 4.50 uIU/mL  Comprehensive metabolic panel  Result Value Ref Range   Sodium 139 135 - 145 mEq/L   Potassium 4.4 3.5 - 5.1 mEq/L   Chloride 103 96 - 112 mEq/L   CO2 28 19 - 32 mEq/L   Glucose, Bld 122 (H) 70 - 99 mg/dL   BUN 14 6 - 23 mg/dL   Creatinine, Ser 1.610.98 0.40 - 1.50 mg/dL   Total Bilirubin 0.6 0.2 - 1.2 mg/dL   Alkaline Phosphatase 48 39 - 117 U/L   AST 15 0 - 37 U/L   ALT 17 0 - 53 U/L   Total Protein 7.6 6.0 - 8.3 g/dL   Albumin 4.4 3.5 - 5.2 g/dL    Calcium 9.6 8.4 - 09.610.5 mg/dL   GFR 04.5487.88 >09.81>60.00 mL/min  Lipid panel  Result Value Ref Range   Cholesterol 220 (H) 0 - 200 mg/dL   Triglycerides 191.4183.0 (H) 0.0 - 149.0 mg/dL   HDL 78.2935.50 (L) >56.21>39.00 mg/dL   VLDL 30.836.6 0.0 - 65.740.0 mg/dL   LDL Cholesterol 846148 (H) 0 - 99 mg/dL   Total CHOL/HDL Ratio 6    NonHDL 184.22       Assessment & Plan:   Problem List Items Addressed This Visit    Supraclavicular mass    Left sided - feels benign (not firm or matted or fixed). Anticipate possible lipoma. Monitor for now. If enlarging or bothersome, will start with CXR and consider CT for further eval.  Perennial allergic rhinitis    nasonex refilled. Also using allergy eye drops.       History of prior use of snuff    Quit snuff 2 months ago! Congratulated on quitting.       Healthcare maintenance - Primary    Preventative protocols reviewed and updated unless pt declined. Discussed healthy diet and lifestyle.       Dyslipidemia    Chronic, elevated off medication. Non smoker - unsure how to change ASCVD score calculation to reflect this.  The 10-year ASCVD risk score Denman George DC Montez Hageman., et al., 2013) is: 10.1%   Values used to calculate the score:     Age: 57 years     Sex: Male     Is Non-Hispanic African American: No     Diabetic: No     Tobacco smoker: Yes     Systolic Blood Pressure: 132 mmHg     Is BP treated: No     HDL Cholesterol: 35.5 mg/dL     Total Cholesterol: 220 mg/dL       Body mass index (BMI) 40.0-44.9, adult (HCC)    Reviewed weight gain noted. Encouraged implement aerobic exercise into weight training routine.           Meds ordered this encounter  Medications  . mometasone (NASONEX) 50 MCG/ACT nasal spray    Sig: Place 2 sprays into the nose daily.    Dispense:  17 g    Refill:  6  . Olopatadine HCl 0.2 % SOLN    Sig: Place 1 drop into both eyes daily.    Dispense:  2.5 mL    Refill:  3   No orders of the defined types were placed in this  encounter.   Follow up plan: Return for annual exam, prior fasting for blood work.  [name], MD

## 2017-09-10 DIAGNOSIS — G4733 Obstructive sleep apnea (adult) (pediatric): Secondary | ICD-10-CM | POA: Diagnosis not present

## 2017-11-29 DIAGNOSIS — J309 Allergic rhinitis, unspecified: Secondary | ICD-10-CM | POA: Diagnosis not present

## 2017-11-29 DIAGNOSIS — G2581 Restless legs syndrome: Secondary | ICD-10-CM | POA: Diagnosis not present

## 2017-11-29 DIAGNOSIS — G4733 Obstructive sleep apnea (adult) (pediatric): Secondary | ICD-10-CM | POA: Diagnosis not present

## 2017-12-14 DIAGNOSIS — G4733 Obstructive sleep apnea (adult) (pediatric): Secondary | ICD-10-CM | POA: Diagnosis not present

## 2018-03-16 DIAGNOSIS — G4733 Obstructive sleep apnea (adult) (pediatric): Secondary | ICD-10-CM | POA: Diagnosis not present

## 2018-06-15 DIAGNOSIS — G4733 Obstructive sleep apnea (adult) (pediatric): Secondary | ICD-10-CM | POA: Diagnosis not present

## 2018-08-04 ENCOUNTER — Other Ambulatory Visit: Payer: Self-pay | Admitting: Family Medicine

## 2018-08-04 DIAGNOSIS — E785 Hyperlipidemia, unspecified: Secondary | ICD-10-CM

## 2018-08-04 DIAGNOSIS — R222 Localized swelling, mass and lump, trunk: Secondary | ICD-10-CM

## 2018-08-05 ENCOUNTER — Other Ambulatory Visit: Payer: Self-pay

## 2018-08-05 ENCOUNTER — Other Ambulatory Visit: Payer: 59

## 2018-08-05 ENCOUNTER — Other Ambulatory Visit (INDEPENDENT_AMBULATORY_CARE_PROVIDER_SITE_OTHER): Payer: 59

## 2018-08-05 DIAGNOSIS — E785 Hyperlipidemia, unspecified: Secondary | ICD-10-CM

## 2018-08-05 DIAGNOSIS — R222 Localized swelling, mass and lump, trunk: Secondary | ICD-10-CM

## 2018-08-05 LAB — COMPREHENSIVE METABOLIC PANEL
ALT: 19 U/L (ref 0–53)
AST: 15 U/L (ref 0–37)
Albumin: 4.3 g/dL (ref 3.5–5.2)
Alkaline Phosphatase: 54 U/L (ref 39–117)
BUN: 16 mg/dL (ref 6–23)
CO2: 26 mEq/L (ref 19–32)
Calcium: 9.5 mg/dL (ref 8.4–10.5)
Chloride: 103 mEq/L (ref 96–112)
Creatinine, Ser: 0.88 mg/dL (ref 0.40–1.50)
GFR: 93.2 mL/min (ref >60.00)
Glucose, Bld: 102 mg/dL — ABNORMAL HIGH (ref 70–99)
Potassium: 4.4 mEq/L (ref 3.5–5.1)
Sodium: 138 mEq/L (ref 135–145)
Total Bilirubin: 0.7 mg/dL (ref 0.2–1.2)
Total Protein: 6.9 g/dL (ref 6.0–8.3)

## 2018-08-05 LAB — CBC WITH DIFFERENTIAL/PLATELET
Basophils Absolute: 0 10*3/uL (ref 0.0–0.1)
Basophils Relative: 0.7 % (ref 0.0–3.0)
Eosinophils Absolute: 0.2 10*3/uL (ref 0.0–0.7)
Eosinophils Relative: 3.3 % (ref 0.0–5.0)
HCT: 44.7 % (ref 39.0–52.0)
Hemoglobin: 15 g/dL (ref 13.0–17.0)
Lymphocytes Relative: 27.8 % (ref 12.0–46.0)
Lymphs Abs: 1.7 10*3/uL (ref 0.7–4.0)
MCHC: 33.6 g/dL (ref 30.0–36.0)
MCV: 86.9 fl (ref 78.0–100.0)
Monocytes Absolute: 0.5 10*3/uL (ref 0.1–1.0)
Monocytes Relative: 8.9 % (ref 3.0–12.0)
Neutro Abs: 3.6 10*3/uL (ref 1.4–7.7)
Neutrophils Relative %: 59.3 % (ref 43.0–77.0)
Platelets: 254 10*3/uL (ref 150.0–400.0)
RBC: 5.14 Mil/uL (ref 4.22–5.81)
RDW: 13.4 % (ref 11.5–15.5)
WBC: 6.1 10*3/uL (ref 4.0–10.5)

## 2018-08-05 LAB — LIPID PANEL
Cholesterol: 229 mg/dL — ABNORMAL HIGH (ref 0–200)
HDL: 46 mg/dL (ref >39.00)
LDL Cholesterol: 161 mg/dL — ABNORMAL HIGH (ref 0–99)
NonHDL: 183.19
Total CHOL/HDL Ratio: 5
Triglycerides: 111 mg/dL (ref 0.0–149.0)
VLDL: 22.2 mg/dL (ref 0.0–40.0)

## 2018-08-05 LAB — TSH: TSH: 1.16 u[IU]/mL (ref 0.35–4.50)

## 2018-08-10 ENCOUNTER — Encounter: Payer: 59 | Admitting: Family Medicine

## 2018-08-12 ENCOUNTER — Other Ambulatory Visit: Payer: Self-pay

## 2018-08-12 ENCOUNTER — Encounter: Payer: Self-pay | Admitting: Family Medicine

## 2018-08-12 ENCOUNTER — Ambulatory Visit (INDEPENDENT_AMBULATORY_CARE_PROVIDER_SITE_OTHER): Payer: 59 | Admitting: Family Medicine

## 2018-08-12 VITALS — BP 128/76 | HR 90 | Temp 97.8°F | Ht 73.0 in | Wt 285.4 lb

## 2018-08-12 DIAGNOSIS — E785 Hyperlipidemia, unspecified: Secondary | ICD-10-CM | POA: Diagnosis not present

## 2018-08-12 DIAGNOSIS — G4733 Obstructive sleep apnea (adult) (pediatric): Secondary | ICD-10-CM | POA: Diagnosis not present

## 2018-08-12 DIAGNOSIS — Z0000 Encounter for general adult medical examination without abnormal findings: Secondary | ICD-10-CM

## 2018-08-12 DIAGNOSIS — Z9989 Dependence on other enabling machines and devices: Secondary | ICD-10-CM

## 2018-08-12 MED ORDER — MOMETASONE FUROATE 50 MCG/ACT NA SUSP: 2.0000 | Freq: Every day | NASAL | 6 refills | Status: DC

## 2018-08-12 NOTE — Assessment & Plan Note (Signed)
Preventative protocols reviewed and updated unless pt declined. Discussed healthy diet and lifestyle.  

## 2018-08-12 NOTE — Assessment & Plan Note (Signed)
Congratulated on 60 lb weight loss in the past year through healthy diet and lifestyle changes! Pt motivated for ongoing efforts. I quoted 240 lb as next goal.

## 2018-08-12 NOTE — Patient Instructions (Signed)
Congratulations on weight loss! Keep it up! Labs are looking better. Return as needed or in 1 year for next physical.  Health Maintenance, Male A healthy lifestyle and preventive care is important for your health and wellness. Ask your health care provider about what schedule of regular examinations is right for you. What should I know about weight and diet? Eat a Healthy Diet  Eat plenty of vegetables, fruits, whole grains, low-fat dairy products, and lean protein.  Do not eat a lot of foods high in solid fats, added sugars, or salt.  Maintain a Healthy Weight Regular exercise can help you achieve or maintain a healthy weight. You should:  Do at least 150 minutes of exercise each week. The exercise should increase your heart rate and make you sweat (moderate-intensity exercise).  Do strength-training exercises at least twice a week. Watch Your Levels of Cholesterol and Blood Lipids  Have your blood tested for lipids and cholesterol every 5 years starting at 46 years of age. If you are at high risk for heart disease, you should start having your blood tested when you are 46 years old. You may need to have your cholesterol levels checked more often if: ? Your lipid or cholesterol levels are high. ? You are older than 46 years of age. ? You are at high risk for heart disease. What should I know about cancer screening? Many types of cancers can be detected early and may often be prevented. Lung Cancer  You should be screened every year for lung cancer if: ? You are a current smoker who has smoked for at least 30 years. ? You are a former smoker who has quit within the past 15 years.  Talk to your health care provider about your screening options, when you should start screening, and how often you should be screened. Colorectal Cancer  Routine colorectal cancer screening usually begins at 46 years of age and should be repeated every 5-10 years until you are 46 years old. You may need  to be screened more often if early forms of precancerous polyps or small growths are found. Your health care provider may recommend screening at an earlier age if you have risk factors for colon cancer.  Your health care provider may recommend using home test kits to check for hidden blood in the stool.  A small camera at the end of a tube can be used to examine your colon (sigmoidoscopy or colonoscopy). This checks for the earliest forms of colorectal cancer. Prostate and Testicular Cancer  Depending on your age and overall health, your health care provider may do certain tests to screen for prostate and testicular cancer.  Talk to your health care provider about any symptoms or concerns you have about testicular or prostate cancer. Skin Cancer  Check your skin from head to toe regularly.  Tell your health care provider about any new moles or changes in moles, especially if: ? There is a change in a mole's size, shape, or color. ? You have a mole that is larger than a pencil eraser.  Always use sunscreen. Apply sunscreen liberally and repeat throughout the day.  Protect yourself by wearing long sleeves, pants, a wide-brimmed hat, and sunglasses when outside. What should I know about heart disease, diabetes, and high blood pressure?  If you are 58-29 years of age, have your blood pressure checked every 3-5 years. If you are 22 years of age or older, have your blood pressure checked every year. You should have  your blood pressure measured twice-once when you are at a hospital or clinic, and once when you are not at a hospital or clinic. Record the average of the two measurements. To check your blood pressure when you are not at a hospital or clinic, you can use: ? An automated blood pressure machine at a pharmacy. ? A home blood pressure monitor.  Talk to your health care provider about your target blood pressure.  If you are between 5345-818 years old, ask your health care provider if you  should take aspirin to prevent heart disease.  Have regular diabetes screenings by checking your fasting blood sugar level. ? If you are at a normal weight and have a low risk for diabetes, have this test once every three years after the age of 46. ? If you are overweight and have a high risk for diabetes, consider being tested at a younger age or more often.  A one-time screening for abdominal aortic aneurysm (AAA) by ultrasound is recommended for men aged 65-75 years who are current or former smokers. What should I know about preventing infection? Hepatitis B If you have a higher risk for hepatitis B, you should be screened for this virus. Talk with your health care provider to find out if you are at risk for hepatitis B infection. Hepatitis C Blood testing is recommended for:  Everyone born from [birth year] through 1965.  Anyone with known risk factors for hepatitis C. Sexually Transmitted Diseases (STDs)  You should be screened each year for STDs including gonorrhea and chlamydia if: ? You are sexually active and are younger than 46 years of age. ? You are older than 46 years of age and your health care provider tells you that you are at risk for this type of infection. ? Your sexual activity has changed since you were last screened and you are at an increased risk for chlamydia or gonorrhea. Ask your health care provider if you are at risk.  Talk with your health care provider about whether you are at high risk of being infected with HIV. Your health care provider may recommend a prescription medicine to help prevent HIV infection. What else can I do?  Schedule regular health, dental, and eye exams.  Stay current with your vaccines (immunizations).  Do not use any tobacco products, such as cigarettes, chewing tobacco, and e-cigarettes. If you need help quitting, ask your health care provider.  Limit alcohol intake to no more than 2 drinks per day. One drink equals 12 ounces of beer, 5  ounces of wine, or 1 ounces of hard liquor.  Do not use street drugs.  Do not share needles.  Ask your health care provider for help if you need support or information about quitting drugs.  Tell your health care provider if you often feel depressed.  Tell your health care provider if you have ever been abused or do not feel safe at home. This information is not intended to replace advice given to you by your health care provider. Make sure you discuss any questions you have with your health care provider. Document Released: 08/01/2007 Document Revised: 10/02/2015 Document Reviewed: 11/06/2014 Elsevier Interactive Patient Education  2019 [employer]

## 2018-08-12 NOTE — Progress Notes (Signed)
This visit was conducted in person.  BP 128/76 (BP Location: Left Arm, Patient Position: Sitting, Cuff Size: Large)   Pulse 90   Temp 97.8 F (36.6 C) (Tympanic)   Ht 6\' 1"  (1.854 m)   Wt 285 lb 7 oz (129.5 kg)   SpO2 97%   BMI 37.66 kg/m   BP Readings from Last 3 Encounters:  08/12/18 128/76  07/28/17 132/72  07/07/16 122/80    CC: CPE Subjective:    Patient ID: Danny Cox, male    DOB: 12-30-1970, 48 y.o.   MRN: [id number]  HPI: Danny FerrariBryan M Hines is a 48 y.o. male presenting on 08/12/2018 for Annual Exam   60 lb weight loss! He started walking routine 1-2 mi/day, cut out carbs (bread), started intermittent fasting.    OSA - continues CPAP at night - needed for CDL.   Preventative: Declines flushot Td 2006, Tdap 2016 Seat belt use discussed Sunscreen use discussed.No changing moles on skin. Saw derm.  Non smoker - some dipping. Chewing nicotine gum occasionally.  Alcohol - 6 beers a month Dentist - Q6 mo Eye exam - has not seen regularly   Caffeine: 2-3 cups coffee/day Lives with wife, 1 cat and 1 dog  Occupation: Midwifeignal Maintainer for railway  Edu: college  Activity: walking 1-2 mi/day Diet: good water, fruits/vegetablessome     Relevant past medical, surgical, family and social history reviewed and updated as indicated. Interim medical history since our last visit reviewed. Allergies and medications reviewed and updated. Outpatient Medications Prior to Visit  Medication Sig Dispense Refill  . NON FORMULARY prosta -strong prostate supplement    . Olopatadine HCl 0.2 % SOLN Place 1 drop into both eyes daily. 2.5 mL 3  . mometasone (NASONEX) 50 MCG/ACT nasal spray Place 2 sprays into the nose daily. 17 g 6   No facility-administered medications prior to visit.      Per HPI unless specifically indicated in ROS section below Review of Systems  Constitutional: Negative for activity change, appetite change, chills, fatigue, fever and unexpected weight  change.  HENT: Negative for hearing loss.   Eyes: Negative for visual disturbance.  Respiratory: Negative for cough, chest tightness, shortness of breath and wheezing.   Cardiovascular: Negative for chest pain, palpitations and leg swelling.  Gastrointestinal: Negative for abdominal distention, abdominal pain, blood in stool, constipation, diarrhea, nausea and vomiting.  Genitourinary: Negative for difficulty urinating and hematuria.  Musculoskeletal: Negative for arthralgias, myalgias and neck pain.  Skin: Negative for rash.  Neurological: Negative for dizziness, seizures, syncope and headaches.  Hematological: Negative for adenopathy. Does not bruise/bleed easily.  Psychiatric/Behavioral: Negative for dysphoric mood. The patient is not nervous/anxious.    Objective:    BP 128/76 (BP Location: Left Arm, Patient Position: Sitting, Cuff Size: Large)   Pulse 90   Temp 97.8 F (36.6 C) (Tympanic)   Ht 6\' 1"  (1.854 m)   Wt 285 lb 7 oz (129.5 kg)   SpO2 97%   BMI 37.66 kg/m   Wt Readings from Last 3 Encounters:  08/12/18 285 lb 7 oz (129.5 kg)  07/28/17 (!) 332 lb 8 oz (150.8 kg)  07/07/16 (!) 321 lb 8 oz (145.8 kg)    Physical Exam Vitals signs and nursing note reviewed.  Constitutional:      General: He is not in acute distress.    Appearance: Normal appearance. He is well-developed.  HENT:     Head: Normocephalic and atraumatic.     Right Ear:  Hearing, tympanic membrane, ear canal and external ear normal.     Left Ear: Hearing, tympanic membrane, ear canal and external ear normal.     Nose: Nose normal.     Mouth/Throat:     Mouth: Mucous membranes are moist.     Pharynx: Uvula midline. No oropharyngeal exudate or posterior oropharyngeal erythema.  Eyes:     General: No scleral icterus.    Conjunctiva/sclera: Conjunctivae normal.     Pupils: Pupils are equal, round, and reactive to light.  Neck:     Musculoskeletal: Normal range of motion and neck supple.   Cardiovascular:     Rate and Rhythm: Normal rate and regular rhythm.     Pulses: Normal pulses.          Radial pulses are 2+ on the right side and 2+ on the left side.     Heart sounds: Normal heart sounds. No murmur.  Pulmonary:     Effort: Pulmonary effort is normal. No respiratory distress.     Breath sounds: Normal breath sounds. No wheezing, rhonchi or rales.  Abdominal:     General: Bowel sounds are normal. There is no distension.     Palpations: Abdomen is soft. There is no mass.     Tenderness: There is no abdominal tenderness. There is no guarding or rebound.  Musculoskeletal: Normal range of motion.     Right lower leg: No edema.     Left lower leg: No edema.  Lymphadenopathy:     Cervical: No cervical adenopathy.  Skin:    General: Skin is warm and dry.     Findings: No rash.  Neurological:     General: No focal deficit present.     Mental Status: He is alert and oriented to person, place, and time.     Comments: CN grossly intact, station and gait intact  Psychiatric:        Mood and Affect: Mood normal.        Behavior: Behavior normal.        Thought Content: Thought content normal.        Judgment: Judgment normal.       Results for orders placed or performed in visit on 08/05/18  CBC with Differential/Platelet  Result Value Ref Range   WBC 6.1 4.0 - 10.5 K/uL   RBC 5.14 4.22 - 5.81 Mil/uL   Hemoglobin 15.0 13.0 - 17.0 g/dL   HCT 16.144.7 09.639.0 - 04.552.0 %   MCV 86.9 78.0 - 100.0 fl   MCHC 33.6 30.0 - 36.0 g/dL   RDW 40.913.4 81.111.5 - 91.415.5 %   Platelets 254.0 150.0 - 400.0 K/uL   Neutrophils Relative % 59.3 43.0 - 77.0 %   Lymphocytes Relative 27.8 12.0 - 46.0 %   Monocytes Relative 8.9 3.0 - 12.0 %   Eosinophils Relative 3.3 0.0 - 5.0 %   Basophils Relative 0.7 0.0 - 3.0 %   Neutro Abs 3.6 1.4 - 7.7 K/uL   Lymphs Abs 1.7 0.7 - 4.0 K/uL   Monocytes Absolute 0.5 0.1 - 1.0 K/uL   Eosinophils Absolute 0.2 0.0 - 0.7 K/uL   Basophils Absolute 0.0 0.0 - 0.1 K/uL  TSH   Result Value Ref Range   TSH 1.16 0.35 - 4.50 uIU/mL  Lipid panel  Result Value Ref Range   Cholesterol 229 (H) 0 - 200 mg/dL   Triglycerides 782.9111.0 0.0 - 149.0 mg/dL   HDL 56.2146.00 >30.86>39.00 mg/dL   VLDL 57.822.2 0.0 - 40.0  mg/dL   LDL Cholesterol 161 (H) 0 - 99 mg/dL   Total CHOL/HDL Ratio 5    NonHDL 183.19   Comprehensive metabolic panel  Result Value Ref Range   Sodium 138 135 - 145 mEq/L   Potassium 4.4 3.5 - 5.1 mEq/L   Chloride 103 96 - 112 mEq/L   CO2 26 19 - 32 mEq/L   Glucose, Bld 102 (H) 70 - 99 mg/dL   BUN 16 6 - 23 mg/dL   Creatinine, Ser 0.88 0.40 - 1.50 mg/dL   Total Bilirubin 0.7 0.2 - 1.2 mg/dL   Alkaline Phosphatase 54 39 - 117 U/L   AST 15 0 - 37 U/L   ALT 19 0 - 53 U/L   Total Protein 6.9 6.0 - 8.3 g/dL   Albumin 4.3 3.5 - 5.2 g/dL   Calcium 9.5 8.4 - 10.5 mg/dL   GFR 93.20 >60.00 mL/min   Assessment & Plan:   Problem List Items Addressed This Visit    Severe obesity (BMI 35.0-39.9) with comorbidity ([place])    Congratulated on 60 lb weight loss in the past year through healthy diet and lifestyle changes! Pt motivated for ongoing efforts. I quoted 240 lb as next goal.       OSA on CPAP    Continues CPAP use.       Healthcare maintenance - Primary    Preventative protocols reviewed and updated unless pt declined. Discussed healthy diet and lifestyle.       Dyslipidemia    Chronic, improved with healthy diet and lifestyle change and weight loss. Discussed RYR given mildly elevated ASCVD risk. Pt interested.  The 10-year ASCVD risk score Mikey Bussing DC Brooke Bonito., et al., 2013) is: 8%   Values used to calculate the score:     Age: 31 years     Sex: Male     Is Non-Hispanic African American: No     Diabetic: No     Tobacco smoker: Yes     Systolic Blood Pressure: 637 mmHg     Is BP treated: No     HDL Cholesterol: 46 mg/dL     Total Cholesterol: 229 mg/dL           Meds ordered this encounter  Medications  . mometasone (NASONEX) 50 MCG/ACT nasal spray    Sig:  Place 2 sprays into the nose daily.    Dispense:  17 g    Refill:  6   No orders of the defined types were placed in this encounter.   Follow up plan: Return in about 1 year (around 08/12/2019) for annual exam, prior fasting for blood work.  Ria Bush, MD

## 2018-08-12 NOTE — Assessment & Plan Note (Addendum)
Chronic, improved with healthy diet and lifestyle change and weight loss. Discussed RYR given mildly elevated ASCVD risk. Pt interested.  The 10-year ASCVD risk score Mikey Bussing DC Brooke Bonito., et al., 2013) is: 8%   Values used to calculate the score:     Age: 48 years     Sex: Male     Is Non-Hispanic African American: No     Diabetic: No     Tobacco smoker: Yes     Systolic Blood Pressure: 749 mmHg     Is BP treated: No     HDL Cholesterol: 46 mg/dL     Total Cholesterol: 229 mg/dL

## 2018-08-12 NOTE — Assessment & Plan Note (Addendum)
Continues CPAP use.  

## 2019-01-09 ENCOUNTER — Other Ambulatory Visit: Payer: Self-pay

## 2019-01-09 ENCOUNTER — Emergency Department
Admission: EM | Admit: 2019-01-09 | Discharge: 2019-01-09 | Disposition: A | Discharge status: Home / Self Care | Payer: 59 | Attending: Emergency Medicine | Admitting: Emergency Medicine

## 2019-01-09 ENCOUNTER — Telehealth: Payer: Self-pay

## 2019-01-09 ENCOUNTER — Encounter: Payer: Self-pay | Admitting: Emergency Medicine

## 2019-01-09 ENCOUNTER — Emergency Department: Payer: 59

## 2019-01-09 DIAGNOSIS — Z79899 Other long term (current) drug therapy: Secondary | ICD-10-CM | POA: Insufficient documentation

## 2019-01-09 DIAGNOSIS — R438 Other disturbances of smell and taste: Secondary | ICD-10-CM | POA: Insufficient documentation

## 2019-01-09 DIAGNOSIS — S0990XA Unspecified injury of head, initial encounter: Secondary | ICD-10-CM

## 2019-01-09 DIAGNOSIS — U071 COVID-19: Secondary | ICD-10-CM | POA: Diagnosis not present

## 2019-01-09 DIAGNOSIS — Y9289 Other specified places as the place of occurrence of the external cause: Secondary | ICD-10-CM | POA: Diagnosis not present

## 2019-01-09 DIAGNOSIS — Z87891 Personal history of nicotine dependence: Secondary | ICD-10-CM | POA: Insufficient documentation

## 2019-01-09 DIAGNOSIS — Y998 Other external cause status: Secondary | ICD-10-CM | POA: Insufficient documentation

## 2019-01-09 DIAGNOSIS — R5383 Other fatigue: Secondary | ICD-10-CM | POA: Diagnosis not present

## 2019-01-09 DIAGNOSIS — W208XXA Other cause of strike by thrown, projected or falling object, initial encounter: Secondary | ICD-10-CM | POA: Insufficient documentation

## 2019-01-09 DIAGNOSIS — Y93H3 Activity, building and construction: Secondary | ICD-10-CM | POA: Diagnosis not present

## 2019-01-09 DIAGNOSIS — S060X0A Concussion without loss of consciousness, initial encounter: Secondary | ICD-10-CM | POA: Diagnosis not present

## 2019-01-09 MED ORDER — ACETAMINOPHEN 500 MG PO TABS
1000.0000 mg | ORAL_TABLET | Freq: Once | ORAL | Status: AC
  Administered 2019-01-09: 1000 mg via ORAL
  Filled 2019-01-09: qty 2

## 2019-01-09 MED ORDER — ONDANSETRON HCL 4 MG PO TABS: 4.0000 mg | ORAL_TABLET | Freq: Every day | ORAL | 0 refills | Status: DC | PRN

## 2019-01-09 MED ORDER — ONDANSETRON HCL 4 MG PO TABS
4.0000 mg | ORAL_TABLET | Freq: Once | ORAL | Status: AC
  Administered 2019-01-09: 4 mg via ORAL
  Filled 2019-01-09: qty 1

## 2019-01-09 NOTE — ED Triage Notes (Signed)
Pt here with c/o being hit in the head with a large beam as it fell from about 27ft high-he is building a barn and knows he should have been wearing a hard had but was not, states today he just "doesn't feel right today, wife stated he wasn't acting right yesterday after the board hit him, as the board hit he became very dizzy, however denies LOC. Speech clear in triage, states "pressure and pain" on the right temple and side of head, denies use of blood thinners. NAD.

## 2019-01-09 NOTE — Discharge Instructions (Addendum)
You can take 1 g of Tylenol every 8 hours for your headaches.  We have also prescribed some nausea medicine called Zofran.  Your coronavirus testing is pending.  Stay quarantined at home until results.  Follow-up with your primary care doctor for thyroid testing per your CT scan.  Return to the ER for fevers, worsening symptoms or any other concerns.   IMPRESSION: 1. No CT evidence for acute intracranial abnormality. 2. Bilateral proptosis. Suggest correlation with thyroid function studies 3. Straightening of the cervical spine with mild degenerative change. No acute osseous abnormality

## 2019-01-09 NOTE — Telephone Encounter (Signed)
Noted. Thanks.

## 2019-01-09 NOTE — Telephone Encounter (Signed)
I spoke with pt; pt did call  Poison control and was advised to do nothing and drink a lot of water; now pt is feeling OK. Pt will cb if needs further advise or appt; UC & ED precautions given and pt voiced understanding. FYI to Dr Darnell Level.

## 2019-01-09 NOTE — ED Notes (Signed)
Pt verbalizes understanding of d/c instructions, medications and follow up 

## 2019-01-09 NOTE — ED Provider Notes (Signed)
[facility] Emergency Department Provider Note  ____________________________________________   First MD Initiated Contact with Patient 01/09/19 1608     (approximate)  I have reviewed the triage vital signs and the nursing notes.   HISTORY  Chief Complaint Head Injury    HPI Danny Cox is a 48 y.o. male otherwise healthy who presents with head injury.  Patient said around noon yesterday that he was working on building a barn for his wife when a beam fell down and hit him on his right forehead.  Patient is now having intermittent pain in his right forehead that is moderate, has not take anything to help it, nothing seems to bring it on.  He says he has been associated with feeling more fatigued, difficulty concentrating, some loss of taste.  He did have a recent positive coronavirus exposure so is also requesting testing.  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 Authorizing Provider  mometasone (NASONEX) 50 MCG/ACT nasal spray Place 2 sprays into the nose daily. 08/12/18   Ria Bush, MD  NON FORMULARY prosta -strong prostate supplement    [provider]  Olopatadine HCl 0.2 % SOLN Place 1 drop into both eyes daily. 07/28/17   Ria Bush, MD    Allergies Flonase [fluticasone propionate] and Sulfa antibiotics  Family History  Problem Relation Age of Onset   Stroke Paternal Grandfather        mini   Diabetes Paternal Grandfather    Prostatitis Father    CAD Maternal Uncle 62       MI   Cancer Neg Hx    Hypertension Neg Hx     Social History Social History   Tobacco Use   Smoking status: Never Smoker   Smokeless tobacco: Former Systems developer    Types: Snuff   Tobacco comment: 1 can/day  Substance Use Topics   Alcohol use: Yes    Comment: occasional   Drug use: No      Review of Systems Constitutional: No fever/chills Eyes: No visual changes. ENT: No sore throat.  Loss of taste Cardiovascular: Denies chest pain. Respiratory: Denies shortness of breath. Gastrointestinal: No abdominal pain.  No nausea, no vomiting.  No diarrhea.  No  constipation. Genitourinary: Negative for dysuria. Musculoskeletal: Negative for back pain. Skin: Negative for rash. Neurological: Negative for headaches, focal weakness or numbness.  Positive head injury All other ROS negative ____________________________________________   PHYSICAL EXAM:  VITAL SIGNS: ED Triage Vitals  Enc Vitals Group     BP 01/09/19 1343 (!) 145/81     Pulse Rate 01/09/19 1343 86     Resp 01/09/19 1343 19     Temp 01/09/19 1343 98.5 F (36.9 C)     Temp Source 01/09/19 1343 Oral     SpO2 01/09/19 1343 98 %     Weight 01/09/19 1343 285 lb (129.3 kg)     Height 01/09/19 1343 6\' 3"  (1.905 m)     Head Circumference --      Peak Flow --      Pain Score 01/09/19 1349 7      Pain Loc --      Pain Edu? --      Excl. in GC? --     Constitutional: Alert and oriented. Well appearing and in no acute distress. Eyes: Conjunctivae are normal. EOMI. Head: Atraumatic. Nose: No congestion/rhinnorhea. Mouth/Throat: Mucous membranes are moist.   Neck: No stridor. Trachea Midline. FROM Cardiovascular: Normal rate, regular rhythm. Grossly normal heart sounds.  Good peripheral circulation. Respiratory: Normal respiratory effort.  No retractions. Lungs CTAB. Gastrointestinal: Soft and nontender. No distention. No abdominal bruits.  Musculoskeletal: No lower extremity tenderness nor edema.  No joint effusions. Neurologic:  Normal speech and language. Cranial nerves II through XII are intact.  Equal strength in his arms and legs. Skin:  Skin is warm, dry and intact. No rash noted. Psychiatric: Mood and affect are normal. Speech and behavior are normal. GU: Deferred   ____________________________________________   LABS (all labs ordered are listed, but only abnormal results are displayed)  Labs Reviewed  SARS CORONAVIRUS 2 (TAT 6-24 HRS)   ____________________________________________   ED ECG REPORT I, 01/11/19, the attending physician, personally viewed and interpreted this ECG.  EKG is normal sinus, no ST elevation, T wave inversion in lead III, normal intervals. ____________________________________________  RADIOLOGY   Official radiology report(s): Ct Head Wo Contrast  Result Date: 01/09/2019 CLINICAL DATA:  Hit in head with B EXAM: CT HEAD WITHOUT CONTRAST CT CERVICAL SPINE WITHOUT CONTRAST TECHNIQUE: Multidetector CT imaging of the head and cervical spine was performed following the standard protocol without intravenous contrast. Multiplanar CT image reconstructions of the cervical spine were also generated. COMPARISON:  None. FINDINGS: CT HEAD FINDINGS Brain: No acute territorial infarction, hemorrhage or intracranial mass. Slight ventricular prominence  without significant atrophy. Vascular: No hyperdense vessels.  No unexpected calcification Skull: Normal. Negative for fracture or focal lesion. Sinuses/Orbits: Mucosal thickening in the ethmoid sinuses. Opacified left maxillary sinus. The globes are proptotic. Other: None CT CERVICAL SPINE FINDINGS Alignment: Straightening of the cervical spine. No subluxation. Facet alignment within normal limits. Skull base and vertebrae: No acute fracture. No primary bone lesion or focal pathologic process. Soft tissues and spinal canal: No prevertebral fluid or swelling. No visible canal hematoma. Disc levels: Mild diffuse degenerative change C5-C6, C6-C7 and C7-T1. Upper chest: Negative. Other: None IMPRESSION: 1. No CT evidence for acute intracranial abnormality. 2. Bilateral proptosis. Suggest correlation with thyroid function studies 3. Straightening of the cervical spine with mild degenerative change. No acute osseous abnormality Electronically Signed   By: 01/11/2019 M.D.   On: 01/09/2019 14:21   Ct Cervical Spine Wo Contrast  Result Date: 01/09/2019  CLINICAL DATA:  Hit in head with B EXAM: CT HEAD WITHOUT CONTRAST CT CERVICAL SPINE WITHOUT CONTRAST TECHNIQUE: Multidetector CT imaging of the head and cervical spine was performed following the standard protocol without intravenous contrast. Multiplanar CT image reconstructions of the cervical spine were also generated. COMPARISON:  None. FINDINGS: CT HEAD FINDINGS Brain: No acute territorial infarction, hemorrhage or intracranial mass. Slight ventricular prominence without significant atrophy. Vascular: No hyperdense vessels.  No unexpected calcification Skull: Normal. Negative for fracture or focal lesion. Sinuses/Orbits: Mucosal thickening in the ethmoid sinuses. Opacified left maxillary sinus. The globes are proptotic. Other: None CT CERVICAL SPINE FINDINGS Alignment: Straightening of the cervical spine. No subluxation. Facet alignment within normal limits.  Skull base and vertebrae: No acute fracture. No primary bone lesion or focal pathologic process. Soft tissues and spinal canal: No prevertebral fluid or swelling. No visible canal hematoma. Disc levels: Mild diffuse degenerative change C5-C6, C6-C7 and C7-T1. Upper chest: Negative. Other: None IMPRESSION: 1. No CT evidence for acute intracranial abnormality. 2. Bilateral proptosis. Suggest correlation with thyroid function studies 3. Straightening of the cervical spine with mild degenerative change. No acute osseous abnormality Electronically Signed   By: Jasmine PangKim  Fujinaga M.D.   On: 01/09/2019 14:21    ____________________________________________   PROCEDURES  Procedure(s) performed (including Critical Care):  Procedures   ____________________________________________   INITIAL IMPRESSION / ASSESSMENT AND PLAN / ED COURSE  Alisia FerrariBryan M Cimmino was evaluated in Emergency Department on 01/09/2019 for the symptoms described in the history of present illness. He was evaluated in the context of the global COVID-19 pandemic, which necessitated consideration that the patient might be at risk for infection with the SARS-CoV-2 virus that causes COVID-19. Institutional protocols and algorithms that pertain to the evaluation of patients at risk for COVID-19 are in a state of rapid change based on information released by regulatory bodies including the CDC and federal and state organizations. These policies and algorithms were followed during the patient's care in the ED.    Patient is a well-appearing 48 year old who presents with injury to his head that occurred yesterday.  His symptoms sound most consistent with a concussion.  Will get CT head to evaluate for intracranial hemorrhage.  CT cervical is evaluated to rule out cervical fracture.  Patient also reporting a loss of taste with a positive contact coronavirus.  Patient has no shortness of breath or chest pain suggesting an chest x-ray.  Will get outpatient  testing.  Patient was to quarantine at home until these results come back.  Will give a short course of Zofran to help with some of his nausea after the injury.  Patient will be provided a work note.   CT imaging was negative except for concern for possible proptosis and recommended correlating with thyroid function studies.  Discussed with patient he has not had any changes in feeling like his eyes are more proptotic than previous.  He denies any symptoms of hyperthyroidism.  Given he has no evidence of toxic storm patient feels comfortable following up with his primary care doctor for thyroid testing.  I discussed the provisional nature of ED diagnosis, the treatment so far, the ongoing plan of care, follow up appointments and return precautions with the patient and any family or support people present. They expressed understanding and agreed with the plan, discharged home.   ____________________________________________   FINAL CLINICAL IMPRESSION(S) / ED DIAGNOSES   Final diagnoses:  Concussion without loss of consciousness, initial encounter  Closed head injury, initial encounter  MEDICATIONS GIVEN DURING THIS VISIT:  Medications  acetaminophen (TYLENOL) tablet 1,000 mg (has no administration in time range)  ondansetron (ZOFRAN) tablet 4 mg (has no administration in time range)     ED Discharge Orders         Ordered    ondansetron (ZOFRAN) 4 MG tablet  Daily PRN     01/09/19 1622           Note:  This document was prepared using Dragon voice recognition software and may include unintentional dictation errors.   Concha Se, MD 01/09/19 [phone]

## 2019-01-09 NOTE — ED Provider Notes (Signed)
[facility] Emergency Department Provider Note  ____________________________________________   None    (approximate)   I have reviewed the triage vital signs and the nursing notes.   Patient has been triaged with a MSE exam performed by myself at a minimum. Based on symptoms and screening exam, patient may receive a more in-depth exam, labs, imaging as detailed below. Patients have been advised of this setting and exam type at the time of patient interview.    HISTORY  Chief Complaint Head Injury    HPI Danny Cox is a 48 y.o. male presents to the emergency department with a complaint of head injury, confused thoughts, visual changes, temperament changes.  Patient states he was building a shed for his wife yesterday.  He states a 2 x 12 x 16 being that was 8 feet high dropped on his head.  Pain on the right side of the head.  Some nausea and vomiting.  Patient states he felt okay yesterday but awoke with a bad headache.  All the pressure is on the right side of the head.  Some neck pain..   Patient will receive a medical screening exam as detailed below.  Based off of this exam, more in depth exam, labs, imaging will be performed as needed for complaint.  Patient care will be eventually transferred to another provider in the emergency department for final exam, diagnosis and disposition.    Past Medical History:  Diagnosis Date  . Elevated BP    resolved with OSA treatment  . Headache(784.0)    sinus  . Heartburn    tums  . Horseshoe kidney 2014   found incidentally on CT abd for kidney stone  . Irregular heart rhythm   . Nephrolithiasis 2014   ca oxalate  . Obesity   . OSA on CPAP 2011  . Perennial allergic rhinitis    predominantly congestion  . Tinnitus    right ear  . Tobacco dipper quit 2015    Patient Active Problem List   Diagnosis Date Noted  . 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 RLS (restless legs syndrome) 12/14/2016  .  Dyslipidemia 07/04/2016  . Bilateral hand numbness 07/10/2014  . Horseshoe kidney   . Nephrolithiasis   . Healthcare maintenance 04/08/2012  . Perennial allergic rhinitis   . Heartburn   . Tinnitus   . OSA on CPAP   . History of prior use of snuff   . Severe obesity (BMI 35.0-39.9) with comorbidity [facility])     Past Surgical History:  Procedure Laterality Date  . TONSILLECTOMY AND ADENOIDECTOMY  1984    Prior to Admission medications   Medication Sig Start Date End Date Taking? Authorizing Provider  mometasone (NASONEX) 50 MCG/ACT nasal spray Place 2 sprays into the nose daily. 08/12/18   [name], MD  NON FORMULARY prosta -strong prostate supplement    [provider]  Olopatadine HCl 0.2 % SOLN Place 1 drop into both eyes daily. 07/28/17   [name], MD    Allergies Flonase [fluticasone propionate] and Sulfa antibiotics  Family History  Problem Relation Age of Onset  . Stroke Paternal Grandfather        mini  . Diabetes Paternal Grandfather   . Prostatitis Father   . CAD Maternal Uncle 55       MI  . Cancer Neg Hx   . Hypertension Neg Hx     Social History Social History   Tobacco Use  . Smoking status: Never  Smoker  . Smokeless tobacco: Former Systems developer    Types: Snuff  . Tobacco comment: 1 can/day  Substance Use Topics  . Alcohol use: Yes    Comment: occasional  . Drug use: No    Review of Systems Constitutional: Denies fever ENT: Denies nasal congestion/rhinorhea.  Denies sore throat Cardiovascular: Denies chest pain. Respiratory: Denies cough.  Denies shortness of breath/difficulty breathing Gastroenterology: Denies abdominal pain Musculoskeletal: Denies for musculoskeletal pain Integumentary: Negative for rash. Neurological: No focal weakness nor numbness.   ____________________________________________   PHYSICAL EXAM:  VITAL SIGNS: ED Triage Vitals  Enc Vitals Group     BP 01/09/19 1343 (!) 145/81     Pulse Rate  01/09/19 1343 86     Resp 01/09/19 1343 19     Temp 01/09/19 1343 98.5 F (36.9 C)     Temp Source 01/09/19 1343 Oral     SpO2 01/09/19 1343 98 %     Weight 01/09/19 1343 285 lb (129.3 kg)     Height 01/09/19 1343 6\' 3"  (1.905 m)     Head Circumference --      Peak Flow --      Pain Score 01/09/19 1349 7     Pain Loc --      Pain Edu? --      Excl. in [place]? --     Constitutional: Alert and oriented. Generally well appearing and in no acute distress. Head: Right side of the skull is tender to palpation, no swelling noted Eyes: Conjunctivae are normal.  Nose: No significant congestion/rhinnorhea. Mouth: No gross oropharyngeal edema.  Neck: No stridor.  No meningeal signs.   Cardiovascular: Grossly normal heart sounds. Respiratory: Normal respiratory effort without significant tachypnea and no observed retractions. Lungs CTA Musculoskeletal: No gross deformities of extremities. Neurologic:  Normal speech and language. No gross focal neurologic deficits are appreciated.  Skin:  Skin is warm, dry and intact. No rash noted.    ____________________________________________   LABS (all labs ordered are listed, but only abnormal results are displayed)  Labs Reviewed - No data to display  ____________________________________________   RADIOLOGY CT of the head and C-spine  Official radiology report(s): No results found.  ____________________________________________    INITIAL IMPRESSION / MDM / ASSESSMENT AND PLAN / ED COURSE  As part of my medical decision making, I reviewed the following data within the [place] notes reviewed and incorporated, Notes from prior ED visits and  Controlled Substance Database      Clinical Impression: Head injury, possible concussion    Patient has been screened based based on their arrival complaint, evaluated for an emergent condition, and at a minimum has received a medical screening exam.  At this time,  patient will receive further work-up as determined by medical screening exam.  Patient care will eventually be transferred to another provider in the emergency department for final diagnosis and disposition.    ____________________________________________  Note:  This document was prepared using Systems analyst and may include unintentional dictation errors.    Versie Starks, PA-C 01/09/19 1421    Earleen Newport, MD 01/09/19 [phone]

## 2019-01-09 NOTE — ED Notes (Signed)
Pt reports was working on building a barn yesterday and a piece of wood fell and hit him in the head. Pt reports not has difficulty reading, no taste, headache and is tired. MD in room to assess patient. See MD noted for detailed assessment.

## 2019-01-09 NOTE — Telephone Encounter (Signed)
[place] Night - Client TELEPHONE ADVICE RECORD [facility] Patient Name: Danny Cox Gender: Male DOB: 1970/07/31 Age: 48 Y 67 M 23 D Return Phone Number: [phone] (Primary) Address: City/State/Zip: [place] [address] Client [facility] [place] Night - Client Client Site [place] Physician Ria Bush - MD Contact Type Call Who Is Calling Patient / Member / Family / Caregiver Call Type Triage / Clinical Relationship To Patient Self Return Phone Number [phone] (Primary) Chief Complaint Inhalation Exposure Reason for Call Symptomatic / Request for [name] he thinks he could have soaked up something with his skin, he was working with chemicals. He felt high for several hours and requesting a blood draw. He was wearing gloves. He thinks he may have ingested something or absorbed something within his skin. Translation No Nurse Assessment Nurse: May, RN, Tammy Date/Time Eilene Ghazi Time): 01/09/2019 7:40:30 AM Confirm and document reason for call. If symptomatic, describe symptoms. ---Caller states he was making a stain last night. He states it is tar based and gasoline based. Caller states he felt high last night at the grocery store. This morning he doesn't feel like he rested well. Caller states he doesn't feel high this morning. Has the patient had close contact with a person known or suspected to have the novel coronavirus illness OR traveled / lives in area with major community spread (including international travel) in the last 14 days from the onset of symptoms? * If Asymptomatic, screen for exposure and travel within the last 14 days. ---No Does the patient have any new or worsening symptoms? ---Yes Will a triage be completed? ---Yes Related visit to physician within the last 2 weeks? ---No Does the PT have any chronic conditions? (i.e.  diabetes, asthma, this includes High risk factors for pregnancy, etc.) ---No Is this a behavioral health or substance abuse call? ---No Guidelines Guideline Title Affirmed Question Affirmed Notes Nurse Date/Time Eilene Ghazi Time) Smoke and Fume Inhalation Chemical fume inhalation (Exception: harmless strong-smelling fumes May, RN, Tammy 01/09/2019 7:42:37 AM PLEASE NOTE: All timestamps contained within this report are represented as [place] Standard Time. CONFIDENTIALTY NOTICE: This fax transmission is intended only for the addressee. It contains information that is legally privileged, confidential or otherwise protected from use or disclosure. If you are not the intended recipient, you are strictly prohibited from reviewing, disclosing, copying using or disseminating any of this information or taking any action in reliance on or regarding this information. If you have received this fax in error, please notify us immediately by telephone so that we can arrange for its return to [place]. Phone: [phone], Toll-Free: [phone], Fax: [phone] Page: 2 of 2 Call Id: [id number] Guidelines Guideline Title Affirmed Question Affirmed Notes Nurse Date/Time Eilene Ghazi Time) like perfume or household cleaners) Disp. Time Eilene Ghazi Time) Disposition Final User 01/09/2019 7:50:46 AM Call [place] Now Yes May, RN, Tammy Caller Disagree/Comply Comply Caller Understands Yes PreDisposition Home Care Care Advice Given Per Guideline CALL POISON CENTER NOW: * You need to call the [facility] now. * The phone number is (___) - ___-____. * Phone number: [phone] [place] NUMBER: FIRST AID ADVICE FOR SMOKE INHALATION, CHEMICAL FUME INHALATION, OR CARBON MONOXIDE EXPOSURE: * Immediately move to fresh air. * Go outdoors (best), CARE ADVICE given per Smoke and Fume Inhalation (Adult) guideline. * You will be connected automatically to your local poison center.

## 2019-01-09 NOTE — ED Notes (Signed)
Pt states beam that fell on his head was 2x12x16.

## 2019-01-10 ENCOUNTER — Telehealth: Payer: Self-pay | Admitting: Emergency Medicine

## 2019-01-10 LAB — SARS CORONAVIRUS 2 (TAT 6-24 HRS): SARS Coronavirus 2: POSITIVE — AB

## 2019-01-10 NOTE — Telephone Encounter (Signed)
Called patient to assure he is aware of covid 19 result positive he is aware.  Explained cdc guidelines for quarantine and isolation

## 2019-01-27 ENCOUNTER — Other Ambulatory Visit: Payer: Self-pay | Admitting: Family Medicine

## 2019-01-27 NOTE — Telephone Encounter (Signed)
[facility] says Nasonex is not covered by pt's ins co.  Suggests Flonase as an alternative.

## 2019-02-22 ENCOUNTER — Other Ambulatory Visit: Payer: Self-pay

## 2019-02-22 ENCOUNTER — Encounter: Payer: Self-pay | Admitting: Family Medicine

## 2019-02-22 ENCOUNTER — Ambulatory Visit (INDEPENDENT_AMBULATORY_CARE_PROVIDER_SITE_OTHER): Payer: 59 | Admitting: Family Medicine

## 2019-02-22 VITALS — BP 130/78 | HR 75 | Temp 98.2°F | Resp 16 | Wt 296.0 lb

## 2019-02-22 DIAGNOSIS — N410 Acute prostatitis: Secondary | ICD-10-CM

## 2019-02-22 LAB — CBC WITH DIFFERENTIAL/PLATELET
Basophils Absolute: 0.1 10*3/uL (ref 0.0–0.1)
Basophils Relative: 0.7 % (ref 0.0–3.0)
Eosinophils Absolute: 0.3 10*3/uL (ref 0.0–0.7)
Eosinophils Relative: 3.3 % (ref 0.0–5.0)
HCT: 44 % (ref 39.0–52.0)
Hemoglobin: 14.6 g/dL (ref 13.0–17.0)
Lymphocytes Relative: 30.8 % (ref 12.0–46.0)
Lymphs Abs: 2.4 10*3/uL (ref 0.7–4.0)
MCHC: 33.2 g/dL (ref 30.0–36.0)
MCV: 86.2 fl (ref 78.0–100.0)
Monocytes Absolute: 0.7 10*3/uL (ref 0.1–1.0)
Monocytes Relative: 9.4 % (ref 3.0–12.0)
Neutro Abs: 4.4 10*3/uL (ref 1.4–7.7)
Neutrophils Relative %: 55.8 % (ref 43.0–77.0)
Platelets: 269 10*3/uL (ref 150.0–400.0)
RBC: 5.11 Mil/uL (ref 4.22–5.81)
RDW: 14 % (ref 11.5–15.5)
WBC: 7.9 10*3/uL (ref 4.0–10.5)

## 2019-02-22 LAB — POC URINALSYSI DIPSTICK (AUTOMATED)
Bilirubin, UA: NEGATIVE
Blood, UA: NEGATIVE
Glucose, UA: NEGATIVE
Ketones, UA: NEGATIVE
Leukocytes, UA: NEGATIVE
Nitrite, UA: NEGATIVE
Protein, UA: NEGATIVE
Spec Grav, UA: 1.02 (ref 1.010–1.025)
Urobilinogen, UA: 0.2 E.U./dL
pH, UA: 7 (ref 5.0–8.0)

## 2019-02-22 LAB — COMPREHENSIVE METABOLIC PANEL
ALT: 17 U/L (ref 0–53)
AST: 17 U/L (ref 0–37)
Albumin: 4.5 g/dL (ref 3.5–5.2)
Alkaline Phosphatase: 51 U/L (ref 39–117)
BUN: 16 mg/dL (ref 6–23)
CO2: 28 mEq/L (ref 19–32)
Calcium: 9.5 mg/dL (ref 8.4–10.5)
Chloride: 100 mEq/L (ref 96–112)
Creatinine, Ser: 0.93 mg/dL (ref 0.40–1.50)
GFR: 87.23 mL/min (ref >60.00)
Glucose, Bld: 93 mg/dL (ref 70–99)
Potassium: 4.4 mEq/L (ref 3.5–5.1)
Sodium: 134 mEq/L — ABNORMAL LOW (ref 135–145)
Total Bilirubin: 0.5 mg/dL (ref 0.2–1.2)
Total Protein: 7.4 g/dL (ref 6.0–8.3)

## 2019-02-22 LAB — PSA: PSA: 2.34 ng/mL (ref 0.10–4.00)

## 2019-02-22 MED ORDER — CIPROFLOXACIN HCL 500 MG PO TABS: 500.0000 mg | ORAL_TABLET | Freq: Two times a day (BID) | ORAL | 0 refills | Status: DC

## 2019-02-22 MED ORDER — NAPROXEN 500 MG PO TABS: ORAL_TABLET | ORAL | 0 refills | Status: DC

## 2019-02-22 NOTE — Assessment & Plan Note (Signed)
Story/exam most consistent with acute prostatitis.  Treat as such with naprosyn, cipro 10d course (in sulfa allergy).  UA normal. Send UCx.  Check labs (CBC, CMP, PSA).  Update if not improving with treatment.  Pt agrees with plan.

## 2019-02-22 NOTE — Progress Notes (Signed)
This visit was conducted in person.  BP 130/78 (BP Location: Left Arm, Patient Position: Sitting, Cuff Size: Large)   Pulse 75   Temp 98.2 F (36.8 C) (Tympanic)   Resp 16   Wt 296 lb (134.3 kg)   BMI 37.00 kg/m    CC: prostate trouble Subjective:    Patient ID: Danny Cox, male    DOB: September 26, 1970, 49 y.o.   MRN: [id number]  HPI: Danny Cox is a 49 y.o. male presenting on 02/22/2019 for Dysuria   1 mo h/o perineal pressure sensation, increased nocturia x1-2, weak stream. Last intercourse had burning on ejaculation. Some perianal rawness, itching after BM, some red with wiping. Increasing urge to have BM, BM changes with looser stools and slowed emptying. Some LLQ and suprapubic abd discomfort. Intermittent dysuria in the mornings, some urinary urgency and frequency.   No fevers/chills, nausea/vomiting, no urethral discharge, blood in urine, flank pain.  No noted hemorrhoids.  No new sexual partners.   fmhx prostatitis.  Stopped smokeless tobacco, now off NRT Increased caffeine intake - up to 8 cups/day since he's stopped nicotine.  Prolonged driving at work.   Did have covid 12/2018 incidentally found due to loss of taste (while seen at ER for concussion). Wife and family did not get sick. Confirmed with covid Ab during blood draw 2 wks later.      Relevant past medical, surgical, family and social history reviewed and updated as indicated. Interim medical history since our last visit reviewed. Allergies and medications reviewed and updated. Outpatient Medications Prior to Visit  Medication Sig Dispense Refill  . fluticasone (FLONASE) 50 MCG/ACT nasal spray Place 2 sprays into both nostrils daily. 18 g 3  . NON FORMULARY prosta -strong prostate supplement    . Olopatadine HCl 0.2 % SOLN Place 1 drop into both eyes daily. 2.5 mL 3  . ondansetron (ZOFRAN) 4 MG tablet Take 1 tablet (4 mg total) by mouth daily as needed for nausea or vomiting. 15 tablet 0   No  facility-administered medications prior to visit.     Per HPI unless specifically indicated in ROS section below Review of Systems Objective:    BP 130/78 (BP Location: Left Arm, Patient Position: Sitting, Cuff Size: Large)   Pulse 75   Temp 98.2 F (36.8 C) (Tympanic)   Resp 16   Wt 296 lb (134.3 kg)   BMI 37.00 kg/m   Wt Readings from Last 3 Encounters:  02/22/19 296 lb (134.3 kg)  01/09/19 285 lb (129.3 kg)  08/12/18 285 lb 7 oz (129.5 kg)    Physical Exam Vitals and nursing note reviewed.  Constitutional:      Appearance: Normal appearance. He is obese. He is not ill-appearing.  Abdominal:     General: Abdomen is flat. Bowel sounds are normal. There is no distension.     Palpations: Abdomen is soft. There is no mass.     Tenderness: There is abdominal tenderness (mild ) in the epigastric area, suprapubic area and left lower quadrant. There is no right CVA tenderness, left CVA tenderness, guarding or rebound. Negative signs include Murphy's sign.     Hernia: No hernia is present. There is no hernia in the left inguinal area or right inguinal area.  Genitourinary:    Pubic Area: No rash.      Penis: Normal.      Testes: Normal.        Right: Mass, tenderness or swelling not present.  Left: Mass, tenderness or swelling not present.     Prostate: Enlarged (35gm). Not tender and no nodules present.     Rectum: Normal. No mass, tenderness, anal fissure, external hemorrhoid or internal hemorrhoid. Normal anal tone.  Musculoskeletal:     Right lower leg: No edema.     Left lower leg: No edema.  Lymphadenopathy:     Lower Body: No right inguinal adenopathy. No left inguinal adenopathy.  Skin:    Findings: No rash.  Neurological:     Mental Status: He is alert.       Results for orders placed or performed in visit on 02/22/19  POCT Urinalysis Dipstick (Automated)  Result Value Ref Range   Color, UA Yellow    Clarity, UA Clear    Glucose, UA Negative Negative    Bilirubin, UA Neg    Ketones, UA Neg    Spec Grav, UA 1.020 1.010 - 1.025   Blood, UA Neg    pH, UA 7.0 5.0 - 8.0   Protein, UA Negative Negative   Urobilinogen, UA 0.2 0.2 or 1.0 E.U./dL   Nitrite, UA Neg    Leukocytes, UA Negative Negative   Assessment & Plan:  This visit occurred during the SARS-CoV-2 public health emergency.  Safety protocols were in place, including screening questions prior to the visit, additional usage of staff PPE, and extensive cleaning of exam room while observing appropriate contact time as indicated for disinfecting solutions.   Problem List Items Addressed This Visit    Acute prostatitis - Primary    Story/exam most consistent with acute prostatitis.  Treat as such with naprosyn, cipro 10d course (in sulfa allergy).  UA normal. Send UCx.  Check labs (CBC, CMP, PSA).  Update if not improving with treatment.  Pt agrees with plan.       Relevant Orders   Comprehensive metabolic panel   CBC with Differential   PSA   Urine culture   POCT Urinalysis Dipstick (Automated) (Completed)       Meds ordered this encounter  Medications  . ciprofloxacin (CIPRO) 500 MG tablet    Sig: Take 1 tablet (500 mg total) by mouth 2 (two) times daily.    Dispense:  20 tablet    Refill:  0   Orders Placed This Encounter  Procedures  . Urine culture  . Comprehensive metabolic panel  . CBC with Differential  . PSA  . POCT Urinalysis Dipstick (Automated)    Follow up plan: No follow-ups on file.  Ria Bush, MD

## 2019-02-22 NOTE — Patient Instructions (Addendum)
Symptoms suspicious for prostatitis. Urine check today, labs today.  Decrease caffeine if able, limit spicy foods. Increase water.  Treat with naprosyn 500mg  twice daily with food for 5-7 days then as needed.  Treat with antibiotic sent to pharmacy as well.   Prostatitis  Prostatitis is swelling or inflammation of the prostate gland. The prostate is a walnut-sized gland that is involved in the production of semen. It is located below a man's bladder, in front of the rectum. There are four types of prostatitis:  Chronic nonbacterial prostatitis. This is the most common type of prostatitis. It may be associated with a viral infection or autoimmune disorder.  Acute bacterial prostatitis. This is the least common type of prostatitis. It starts quickly and is usually associated with a bladder infection, high fever, and shaking chills. It can occur at any age.  Chronic bacterial prostatitis. This type usually results from acute bacterial prostatitis that happens repeatedly (is recurrent) or has not been treated properly. It can occur in men of any age but is most common among middle-aged men whose prostate has begun to get larger. The symptoms are not as severe as symptoms caused by acute bacterial prostatitis.  Prostatodynia or chronic pelvic pain syndrome (CPPS). This type is also called pelvic floor disorder. It is associated with increased muscular tone in the pelvis surrounding the prostate. What are the causes? Bacterial prostatitis is caused by infection from bacteria. Chronic nonbacterial prostatitis may be caused by:  Urinary tract infections (UTIs).  Nerve damage.  A response by the body's disease-fighting system (autoimmune response).  Chemicals in the urine. The causes of the other types of prostatitis are usually not known. What are the signs or symptoms? Symptoms of this condition vary depending upon the type of prostatitis. If you have acute bacterial prostatitis, you may  experience:  Urinary symptoms, such as: ? Painful urination. ? Burning during urination. ? Frequent and sudden urges to urinate. ? Inability to start urinating. ? A weak or interrupted stream of urine.  Vomiting.  Nausea.  Fever.  Chills.  Inability to empty the bladder completely.  Pain in the: ? Muscles or joints. ? Lower back. ? Lower abdomen. If you have any of the other types of prostatitis, you may experience:  Urinary symptoms, such as: ? Sudden urges to urinate. ? Frequent urination. ? Difficulty starting urination. ? Weak urine stream. ? Dribbling after urination.  Discharge from the urethra. The urethra is a tube that opens at the end of the penis.  Pain in the: ? Testicles. ? Penis or tip of the penis. ? Rectum. ? Area in front of the rectum and below the scrotum (perineum).  Problems with sexual function.  Painful ejaculation.  Bloody semen. How is this diagnosed? This condition may be diagnosed based on:  A physical and medical exam.  Your symptoms.  A urine test to check for bacteria.  An exam in which a health care provider uses a finger to feel the prostate (digital rectal exam).  A test of a sample of semen.  Blood tests.  Ultrasound.  Removal of prostate tissue to be examined under a microscope (biopsy).  Tests to check how your body handles urine (urodynamic tests).  A test to look inside your bladder or urethra (cystoscopy). How is this treated? Treatment for this condition depends on the type of prostatitis. Treatment may involve:  Medicines to relieve pain or inflammation.  Medicines to help relax your muscles.  Physical therapy.  Heat therapy.  Techniques to help you control certain body functions (biofeedback).  Relaxation exercises.  Antibiotic medicine, if your condition is caused by bacteria.  Warm water baths (sitz baths). Sitz baths help with relaxing your pelvic floor muscles, which helps to relieve  pressure on the prostate. Follow these instructions at home:   Take over-the-counter and prescription medicines only as told by your health care provider.  If you were prescribed an antibiotic, take it as told by your health care provider. Do not stop taking the antibiotic even if you start to feel better.  If physical therapy, biofeedback, or relaxation exercises were prescribed, do exercises as instructed.  Take sitz baths as directed by your health care provider. For a sitz bath, sit in warm water that is deep enough to cover your hips and buttocks.  Keep all follow-up visits as told by your health care provider. This is important. Contact a health care provider if:  Your symptoms get worse.  You have a fever. Get help right away if:  You have chills.  You feel nauseous.  You vomit.  You feel light-headed or feel like you are going to faint.  You are unable to urinate.  You have blood or blood clots in your urine. This information is not intended to replace advice given to you by your health care provider. Make sure you discuss any questions you have with your health care provider. Document Revised: 04/17/2017 Document Reviewed: 10/24/2015 Elsevier Patient Education  2020 [employer].

## 2019-02-22 NOTE — Telephone Encounter (Signed)
Dr Reece Agar was Naprosyn to be taking OTC or RX?

## 2019-02-23 LAB — URINE CULTURE
MICRO NUMBER:: 10013362
Result:: NO GROWTH
SPECIMEN QUALITY:: ADEQUATE

## 2019-08-10 ENCOUNTER — Other Ambulatory Visit: Payer: Self-pay | Admitting: Family Medicine

## 2019-08-10 DIAGNOSIS — E785 Hyperlipidemia, unspecified: Secondary | ICD-10-CM

## 2019-08-11 ENCOUNTER — Other Ambulatory Visit (INDEPENDENT_AMBULATORY_CARE_PROVIDER_SITE_OTHER): Payer: 59

## 2019-08-11 DIAGNOSIS — E785 Hyperlipidemia, unspecified: Secondary | ICD-10-CM

## 2019-08-11 LAB — LIPID PANEL
Cholesterol: 214 mg/dL — ABNORMAL HIGH (ref 0–200)
HDL: 40.9 mg/dL (ref >39.00)
LDL Cholesterol: 152 mg/dL — ABNORMAL HIGH (ref 0–99)
NonHDL: 173.39
Total CHOL/HDL Ratio: 5
Triglycerides: 109 mg/dL (ref 0.0–149.0)
VLDL: 21.8 mg/dL (ref 0.0–40.0)

## 2019-08-11 LAB — COMPREHENSIVE METABOLIC PANEL
ALT: 15 U/L (ref 0–53)
AST: 15 U/L (ref 0–37)
Albumin: 4.5 g/dL (ref 3.5–5.2)
Alkaline Phosphatase: 44 U/L (ref 39–117)
BUN: 17 mg/dL (ref 6–23)
CO2: 27 mEq/L (ref 19–32)
Calcium: 9.8 mg/dL (ref 8.4–10.5)
Chloride: 103 mEq/L (ref 96–112)
Creatinine, Ser: 1.04 mg/dL (ref 0.40–1.50)
GFR: 76.51 mL/min (ref >60.00)
Glucose, Bld: 99 mg/dL (ref 70–99)
Potassium: 4.7 mEq/L (ref 3.5–5.1)
Sodium: 140 mEq/L (ref 135–145)
Total Bilirubin: 0.6 mg/dL (ref 0.2–1.2)
Total Protein: 7.3 g/dL (ref 6.0–8.3)

## 2019-08-11 LAB — TSH: TSH: 1.78 u[IU]/mL (ref 0.35–4.50)

## 2019-08-14 ENCOUNTER — Ambulatory Visit (INDEPENDENT_AMBULATORY_CARE_PROVIDER_SITE_OTHER): Payer: 59 | Admitting: Family Medicine

## 2019-08-14 ENCOUNTER — Encounter: Payer: Self-pay | Admitting: Family Medicine

## 2019-08-14 ENCOUNTER — Other Ambulatory Visit: Payer: Self-pay

## 2019-08-14 VITALS — BP 132/80 | HR 72 | Temp 97.6°F | Ht 73.0 in | Wt 300.0 lb

## 2019-08-14 DIAGNOSIS — Q631 Lobulated, fused and horseshoe kidney: Secondary | ICD-10-CM

## 2019-08-14 DIAGNOSIS — G4733 Obstructive sleep apnea (adult) (pediatric): Secondary | ICD-10-CM

## 2019-08-14 DIAGNOSIS — Z9989 Dependence on other enabling machines and devices: Secondary | ICD-10-CM

## 2019-08-14 DIAGNOSIS — N200 Calculus of kidney: Secondary | ICD-10-CM

## 2019-08-14 DIAGNOSIS — Z0000 Encounter for general adult medical examination without abnormal findings: Secondary | ICD-10-CM | POA: Diagnosis not present

## 2019-08-14 DIAGNOSIS — E785 Hyperlipidemia, unspecified: Secondary | ICD-10-CM

## 2019-08-14 DIAGNOSIS — Z1211 Encounter for screening for malignant neoplasm of colon: Secondary | ICD-10-CM

## 2019-08-14 NOTE — Assessment & Plan Note (Signed)
Weight gain noted. Encouraged ongoing weight loss through healthy diet and lifestyle changes.

## 2019-08-14 NOTE — Assessment & Plan Note (Signed)
Preventative protocols reviewed and updated unless pt declined. Discussed healthy diet and lifestyle.  

## 2019-08-14 NOTE — Assessment & Plan Note (Signed)
Needs to back off caffeine.

## 2019-08-14 NOTE — Progress Notes (Signed)
This visit was conducted in person.  BP 132/80 (BP Location: Left Arm, Patient Position: Sitting, Cuff Size: Large)   Pulse 72   Temp 97.6 F (36.4 C) (Temporal)   Ht 6\' 1"  (1.854 m)   Wt 300 lb (136.1 kg)   SpO2 99%   BMI 39.58 kg/m    CC: CPE Subjective:    Patient ID: Danny Cox, male    DOB: [birth year], 49 y.o.   MRN: [id number]  HPI: Danny Cox is a 49 y.o. male presenting on 08/14/2019 for Annual Exam   OSA on auto CPAP to setting of ~9-10.  COVID-19 12/2018 - fully recovered.  15 lb weight gain since last year.   Preventative: Colon cancer screening - discussed. Would like to get colonoscopy - will refer.  Declines flushot Td 2006,Tdap 2016 COVID vaccine - discussed, declines as he had COVID 12/2018  Seat belt use discussed  Sunscreen use discussed.No changing moles on skin. Saw derm. Non smoker - stopped dipping 11/2018.  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 Interim medical history since our last visit reviewed. Allergies and medications reviewed and updated. Outpatient Medications Prior to Visit  Medication Sig Dispense Refill  . fluticasone (FLONASE) 50 MCG/ACT nasal spray Place 2 sprays into both nostrils daily. 18 g 3  . NON FORMULARY prosta -strong prostate supplement    . Olopatadine HCl 0.2 % SOLN Place 1 drop into both eyes daily. 2.5 mL 3  . ciprofloxacin (CIPRO) 500 MG tablet Take 1 tablet (500 mg total) by mouth 2 (two) times daily. 20 tablet 0  . naproxen (NAPROSYN) 500 MG tablet Take one po bid x 1 week then prn pain, take with food 40 tablet 0  . ondansetron (ZOFRAN) 4 MG tablet Take 1 tablet  (4 mg total) by mouth daily as needed for nausea or vomiting. 15 tablet 0   No facility-administered medications prior to visit.     Per HPI unless specifically indicated in ROS section below Review of Systems  Constitutional: Negative for activity change, appetite change, chills, fatigue, fever and unexpected weight change.  HENT: Negative for hearing loss.   Eyes: Negative for visual disturbance.  Respiratory: Negative for cough, chest tightness, shortness of breath and wheezing.   Cardiovascular: Negative for chest pain, palpitations and leg swelling.  Gastrointestinal: Negative for abdominal distention, abdominal pain, blood in stool, constipation, diarrhea, nausea and vomiting.  Genitourinary: Negative for difficulty urinating and hematuria.  Musculoskeletal: Negative for arthralgias, myalgias and neck pain.  Skin: Negative for rash.  Neurological: Negative for dizziness, seizures, syncope and headaches.  Hematological: Negative for adenopathy. Does not bruise/bleed easily.  Psychiatric/Behavioral: Negative for dysphoric mood. The patient is not nervous/anxious.    Objective:  BP 132/80 (BP Location: Left Arm, Patient Position: Sitting, Cuff Size: Large)   Pulse 72   Temp 97.6 F (36.4 C) (Temporal)   Ht 6\' 1"  (1.854 m)   Wt 300 lb (136.1 kg)   SpO2 99%   BMI 39.58 kg/m   Wt Readings from Last 3 Encounters:  08/14/19 300 lb (136.1 kg)  02/22/19 296 lb (134.3 kg)  01/09/19 285 lb (129.3 kg)  Physical Exam Vitals and nursing note reviewed.  Constitutional:      General: He is not in acute distress.    Appearance: Normal appearance. He is well-developed. He is not ill-appearing.  HENT:     Head: Normocephalic and atraumatic.     Right Ear: Hearing, tympanic membrane, ear canal and external ear normal.     Left Ear: Hearing, tympanic membrane, ear canal and external ear normal.  Eyes:     General: No scleral icterus.    Extraocular Movements: Extraocular movements  intact.     Conjunctiva/sclera: Conjunctivae normal.     Pupils: Pupils are equal, round, and reactive to light.  Cardiovascular:     Rate and Rhythm: Normal rate and regular rhythm.     Pulses: Normal pulses.          Radial pulses are 2+ on the right side and 2+ on the left side.     Heart sounds: Normal heart sounds. No murmur heard.   Pulmonary:     Effort: Pulmonary effort is normal. No respiratory distress.     Breath sounds: Normal breath sounds. No wheezing, rhonchi or rales.  Abdominal:     General: Abdomen is flat. Bowel sounds are normal. There is no distension.     Palpations: Abdomen is soft. There is no mass.     Tenderness: There is no abdominal tenderness. There is no guarding or rebound.     Hernia: No hernia is present.  Musculoskeletal:        General: Normal range of motion.     Cervical back: Normal range of motion and neck supple.     Right lower leg: No edema.     Left lower leg: No edema.  Lymphadenopathy:     Cervical: No cervical adenopathy.  Skin:    General: Skin is warm and dry.     Findings: No rash.  Neurological:     General: No focal deficit present.     Mental Status: He is alert and oriented to person, place, and time.     Comments: CN grossly intact, station and gait intact  Psychiatric:        Mood and Affect: Mood normal.        Behavior: Behavior normal.        Thought Content: Thought content normal.        Judgment: Judgment normal.       Results for orders placed or performed in visit on 08/11/19  TSH  Result Value Ref Range   TSH 1.78 0.35 - 4.50 uIU/mL  Comprehensive metabolic panel  Result Value Ref Range   Sodium 140 135 - 145 mEq/L   Potassium 4.7 3.5 - 5.1 mEq/L   Chloride 103 96 - 112 mEq/L   CO2 27 19 - 32 mEq/L   Glucose, Bld 99 70 - 99 mg/dL   BUN 17 6 - 23 mg/dL   Creatinine, Ser 1.61 0.40 - 1.50 mg/dL   Total Bilirubin 0.6 0.2 - 1.2 mg/dL   Alkaline Phosphatase 44 39 - 117 U/L   AST 15 0 - 37 U/L   ALT 15 0 -  53 U/L   Total Protein 7.3 6.0 - 8.3 g/dL   Albumin 4.5 3.5 - 5.2 g/dL   GFR 09.60 >45.40 mL/min   Calcium 9.8 8.4 - 10.5 mg/dL  Lipid panel  Result Value Ref Range   Cholesterol 214 (H) 0 - 200 mg/dL   Triglycerides 981.1 0 - 149 mg/dL  HDL 40.90 >39.00 mg/dL   VLDL 21.8 0.0 - 40.0 mg/dL   LDL Cholesterol 152 (H) 0 - 99 mg/dL   Total CHOL/HDL Ratio 5    NonHDL 173.39    Assessment & Plan:  This visit occurred during the SARS-CoV-2 public health emergency.  Safety protocols were in place, including screening questions prior to the visit, additional usage of staff PPE, and extensive cleaning of exam room while observing appropriate contact time as indicated for disinfecting solutions.   Problem List Items Addressed This Visit    Severe obesity (BMI 35.0-39.9) with comorbidity ([place])    Weight gain noted. Encouraged ongoing weight loss through healthy diet and lifestyle changes.       OSA on CPAP    Continues CPAP use.  Followed by ENT.       Nephrolithiasis    Needs to back off caffeine.       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 Encouraged improved water intake.      Healthcare maintenance - Primary    Preventative protocols reviewed and updated unless pt declined. Discussed healthy diet and lifestyle.       Dyslipidemia    Chronic, LDL above goal - reviewed diet choices to improve LDL.  The 10-year ASCVD risk score Mikey Bussing DC Brooke Bonito., et al., 2013) is: 9.2%   Values used to calculate the score:     Age: 76 years     Sex: Male     Is Non-Hispanic African American: No     Diabetic: No     Tobacco smoker: Yes     Systolic Blood Pressure: 921 mmHg     Is BP treated: No     HDL Cholesterol: 40.9 mg/dL     Total Cholesterol: 214 mg/dL        Other Visit Diagnoses    Special screening for malignant neoplasms, colon       Relevant Orders   Ambulatory referral to Gastroenterology       No orders of the defined types were placed in this encounter.  Orders Placed This  Encounter  Procedures  . Ambulatory referral to Gastroenterology    Referral Priority:   Routine    Referral Type:   Consultation    Referral Reason:   Specialty Services Required    Number of Visits Requested:   1    Patient instructions: We will refer you for colonoscopy.  You are doing well today. Continue regular walking for return to weight loss.  Increase water intake.  Return as needed or in 1 year for next physical.   Follow up plan: Return in about 1 year (around 08/13/2020), or if symptoms worsen or fail to improve, for annual exam, prior fasting for blood work.  Ria Bush, MD

## 2019-08-14 NOTE — Assessment & Plan Note (Addendum)
Chronic, LDL above goal - reviewed diet choices to improve LDL.  The 10-year ASCVD risk score Denman George DC Montez Hageman., et al., 2013) is: 9.2%   Values used to calculate the score:     Age: 49 years     Sex: Male     Is Non-Hispanic African American: No     Diabetic: No     Tobacco smoker: Yes     Systolic Blood Pressure: 132 mmHg     Is BP treated: No     HDL Cholesterol: 40.9 mg/dL     Total Cholesterol: 214 mg/dL

## 2019-08-14 NOTE — Assessment & Plan Note (Addendum)
Continues CPAP use.  Followed by ENT.

## 2019-08-14 NOTE — Patient Instructions (Addendum)
We will refer you for colonoscopy.  You are doing well today. Continue regular walking for return to weight loss.  Increase water intake.  Return as needed or in 1 year for next physical.   Health Maintenance, Male Adopting a healthy lifestyle and getting preventive care are important in promoting health and wellness. Ask your health care provider about:  The right schedule for you to have regular tests and exams.  Things you can do on your own to prevent diseases and keep yourself healthy. What should I know about diet, weight, and exercise? Eat a healthy diet   Eat a diet that includes plenty of vegetables, fruits, low-fat dairy products, and lean protein.  Do not eat a lot of foods that are high in solid fats, added sugars, or sodium. Maintain a healthy weight Body mass index (BMI) is a measurement that can be used to identify possible weight problems. It estimates body fat based on height and weight. Your health care provider can help determine your BMI and help you achieve or maintain a healthy weight. Get regular exercise Get regular exercise. This is one of the most important things you can do for your health. Most adults should:  Exercise for at least 150 minutes each week. The exercise should increase your heart rate and make you sweat (moderate-intensity exercise).  Do strengthening exercises at least twice a week. This is in addition to the moderate-intensity exercise.  Spend less time sitting. Even light physical activity can be beneficial. Watch cholesterol and blood lipids Have your blood tested for lipids and cholesterol at 47 years of age, then have this test every 5 years. You may need to have your cholesterol levels checked more often if:  Your lipid or cholesterol levels are high.  You are older than 47 years of age.  You are at high risk for heart disease. What should I know about cancer screening? Many types of cancers can be detected early and may often be  prevented. Depending on your health history and family history, you may need to have cancer screening at various ages. This may include screening for:  Colorectal cancer.  Prostate cancer.  Skin cancer.  Lung cancer. What should I know about heart disease, diabetes, and high blood pressure? Blood pressure and heart disease  High blood pressure causes heart disease and increases the risk of stroke. This is more likely to develop in people who have high blood pressure readings, are of African descent, or are overweight.  Talk with your health care provider about your target blood pressure readings.  Have your blood pressure checked: ? Every 3-5 years if you are 57-41 years of age. ? Every year if you are 54 years old or older.  If you are between the ages of 44 and 92 and are a current or former smoker, ask your health care provider if you should have a one-time screening for abdominal aortic aneurysm (AAA). Diabetes Have regular diabetes screenings. This checks your fasting blood sugar level. Have the screening done:  Once every three years after age 62 if you are at a normal weight and have a low risk for diabetes.  More often and at a younger age if you are overweight or have a high risk for diabetes. What should I know about preventing infection? Hepatitis B If you have a higher risk for hepatitis B, you should be screened for this virus. Talk with your health care provider to find out if you are at risk for  hepatitis B infection. Hepatitis C Blood testing is recommended for:  Everyone born from [birth year] through 1963.  Anyone with known risk factors for hepatitis C. Sexually transmitted infections (STIs)  You should be screened each year for STIs, including gonorrhea and chlamydia, if: ? You are sexually active and are younger than 47 years of age. ? You are older than 47 years of age and your health care provider tells you that you are at risk for this type of  infection. ? Your sexual activity has changed since you were last screened, and you are at increased risk for chlamydia or gonorrhea. Ask your health care provider if you are at risk.  Ask your health care provider about whether you are at high risk for HIV. Your health care provider may recommend a prescription medicine to help prevent HIV infection. If you choose to take medicine to prevent HIV, you should first get tested for HIV. You should then be tested every 3 months for as long as you are taking the medicine. Follow these instructions at home: Lifestyle  Do not use any products that contain nicotine or tobacco, such as cigarettes, e-cigarettes, and chewing tobacco. If you need help quitting, ask your health care provider.  Do not use street drugs.  Do not share needles.  Ask your health care provider for help if you need support or information about quitting drugs. Alcohol use  Do not drink alcohol if your health care provider tells you not to drink.  If you drink alcohol: ? Limit how much you have to 0-2 drinks a day. ? Be aware of how much alcohol is in your drink. In the [place] one drink equals one 12 oz bottle of beer (355 mL), one 5 oz glass of wine (148 mL), or one 1 oz glass of hard liquor (44 mL). General instructions  Schedule regular health, dental, and eye exams.  Stay current with your vaccines.  Tell your health care provider if: ? You often feel depressed. ? You have ever been abused or do not feel safe at home. Summary  Adopting a healthy lifestyle and getting preventive care are important in promoting health and wellness.  Follow your health care provider's instructions about healthy diet, exercising, and getting tested or screened for diseases.  Follow your health care provider's instructions on monitoring your cholesterol and blood pressure. This information is not intended to replace advice given to you by your health care provider. Make sure you  discuss any questions you have with your health care provider. Document Revised: 01/26/2018 Document Reviewed: 01/26/2018 Elsevier Patient Education  2020 [employer].

## 2019-08-14 NOTE — Assessment & Plan Note (Signed)
H/o this. Encouraged improved water intake.

## 2019-12-05 ENCOUNTER — Encounter: Payer: Self-pay | Admitting: Family Medicine

## 2020-01-01 ENCOUNTER — Encounter: Payer: Self-pay | Admitting: Family Medicine

## 2020-01-02 ENCOUNTER — Other Ambulatory Visit: Payer: Self-pay

## 2020-01-02 ENCOUNTER — Ambulatory Visit: Payer: 59 | Admitting: Family Medicine

## 2020-01-02 ENCOUNTER — Encounter: Payer: Self-pay | Admitting: Family Medicine

## 2020-01-02 VITALS — BP 134/78 | HR 87 | Temp 97.8°F | Ht 73.0 in | Wt 305.5 lb

## 2020-01-02 DIAGNOSIS — Z23 Encounter for immunization: Secondary | ICD-10-CM | POA: Diagnosis not present

## 2020-01-02 DIAGNOSIS — S91332A Puncture wound without foreign body, left foot, initial encounter: Secondary | ICD-10-CM

## 2020-01-02 MED ORDER — LEVOFLOXACIN 500 MG PO TABS: 500.0000 mg | ORAL_TABLET | Freq: Every day | ORAL | 0 refills | Status: DC

## 2020-01-02 NOTE — Patient Instructions (Signed)
Tetanus shot today (Td) I do recommend placing you on preventative antibiotic levaquin 500mg  daily for 3 days.  Watch for streaking redness , draining pus, or worsening pain/swelling/stiffness.   Puncture Wound A puncture wound is an injury that is caused by a sharp, thin object that goes through (penetrates) your skin. Usually, a puncture wound does not leave a large opening in your skin, so it may not bleed a lot. However, when you get a puncture wound, dirt or other materials (foreign bodies) can be forced into your wound and can break off inside. This increases the chance of infection, such as tetanus. There are many sharp, pointed objects that can cause puncture wounds, including teeth, nails, splinters of glass, fishhooks, and needles. Treatment may include washing out the wound with a germ-free (sterile) salt-water solution, having the wound opened surgically to remove a foreign object, closing the wound with stitches (sutures), and covering the wound with antibiotic ointment and a bandage (dressing). Depending on what caused the injury, you may also need a tetanus shot or a rabies shot. Follow these instructions at home: Medicines  Take or apply over-the-counter and prescription medicines only as told by your health care provider.  If you were prescribed an antibiotic medicine, take or apply it as told by your health care provider. Do not stop using the antibiotic even if your condition improves. Bathing  Keep the dressing dry as told by your health care provider.  Do not take baths, swim, or use a hot tub until your health care provider approves. Ask your health care provider if you may take showers. You may only be allowed to take sponge baths. Wound care   There are many ways to close and cover a wound. For example, a wound can be closed with sutures, skin glue, or adhesive strips. Follow instructions from your health care provider about how to take care of your wound. Make sure  you: ? Wash your hands with soap and water before and after you change your dressing. If soap and water are not available, use hand sanitizer. ? Change your dressing as told by your health care provider. ? Leave sutures, skin glue, or adhesive strips in place. These skin closures may need to stay in place for 2 weeks or longer. If adhesive strip edges start to loosen and curl up, you may trim the loose edges. Do not remove adhesive strips completely unless your health care provider tells you to do that.  Clean the wound as told by your health care provider.  Do not scratch or pick at the wound.  Check your wound every day for signs of infection. Check for: ? Redness, swelling, or pain. ? Fluid or blood. ? Warmth. ? Pus or a bad smell. General instructions  Raise (elevate) the injured area above the level of your heart while you are sitting or lying down.  If your puncture wound is in your foot, ask your health care provider if you need to avoid putting weight on your foot and for how long. Do not use the injured limb to support your body weight until your health care provider says that you can. Use crutches as told by your health care provider.  Keep all follow-up visits as told by your health care provider. This is important. Contact a health care provider if:  You received a tetanus shot and you have swelling, severe pain, redness, or bleeding at the injection site.  You have a fever.  Your sutures come out.  You notice a bad smell coming from your wound or your dressing.  You notice something coming out of your wound, such as wood or glass.  Your pain is not controlled with medicine.  You have increased redness, swelling, or pain at the site of your wound.  You have fluid, blood, or pus coming from your wound.  You notice a change in the color of your skin near your wound.  You need to change the dressing frequently due to fluid, blood, or pus draining from your  wound.  You develop a new rash.  You develop numbness around your wound.  You have warmth around your wound. Get help right away if:  You develop severe swelling around your wound.  Your pain suddenly increases and is severe.  You develop painful skin lumps.  You have a red streak going away from your wound.  The wound is on your hand or foot and you: ? Cannot properly move a finger or toe. ? Notice that your fingers or toes look pale or bluish. Summary  A puncture wound is an injury that is caused by a sharp, thin object that goes through (penetrates) your skin.  Treatment may include washing out the wound, having the wound opened surgically to remove a foreign object, closing the wound with stitches (sutures), and covering the wound with antibiotic ointment and a bandage (dressing).  Follow instructions from your health care provider about how to take care of your wound.  Contact a health care provider if you have increased redness, swelling, or pain at the site of your wound.  Keep all follow-up visits as told by your health care provider. This is important. This information is not intended to replace advice given to you by your health care provider. Make sure you discuss any questions you have with your health care provider. Document Revised: 09/09/2017 Document Reviewed: 09/09/2017 Elsevier Patient Education  2020 [employer].

## 2020-01-02 NOTE — Progress Notes (Signed)
Patient ID: Danny Cox, male    DOB: [birth year], 49 y.o.   MRN: [id number]  This visit was conducted in person.  BP 134/78 (BP Location: Left Arm, Patient Position: Sitting, Cuff Size: Large)   Pulse 87   Temp 97.8 F (36.6 C) (Temporal)   Ht 6\' 1"  (1.854 m)   Wt (!) 305 lb 8 oz (138.6 kg)   SpO2 97%   BMI 40.31 kg/m    CC: L foot injury  Subjective:   HPI: [name] is a 49 y.o. male presenting on 01/02/2020 for Foot Injury (C/o injuring left foot after stepping on a thorn.  Happened on 12/31/19.  Tdap 06/2014. )   DOI: 12/31/2019 While working in the yard he stepped on dead [place] orange tree, thorn pierced through croc shoe and into left heel. Cleaned wound and applied neosporin. Thinks whole thorn was removed. He thinks thorn penetrated 2.5cm into foot.   No fevers/chills, streaking redness. No draining pus   Not diabetic.  No results found for: HGBA1C      Relevant past medical, surgical, family and social history reviewed and updated as indicated. Interim medical history since our last visit reviewed. Allergies and medications reviewed and updated. Outpatient Medications Prior to Visit  Medication Sig Dispense Refill  . fluticasone (FLONASE) 50 MCG/ACT nasal spray Place 2 sprays into both nostrils daily. 18 g 3  . NON FORMULARY prosta -strong prostate supplement    . Olopatadine HCl 0.2 % SOLN Place 1 drop into both eyes daily. 2.5 mL 3  . pramipexole (MIRAPEX) 0.125 MG tablet Take 0.125 mg by mouth daily as needed.     No facility-administered medications prior to visit.     Per HPI unless specifically indicated in ROS section below Review of Systems Objective:  BP 134/78 (BP Location: Left Arm, Patient Position: Sitting, Cuff Size: Large)   Pulse 87   Temp 97.8 F (36.6 C) (Temporal)   Ht 6\' 1"  (1.854 m)   Wt (!) 305 lb 8 oz (138.6 kg)   SpO2 97%   BMI 40.31 kg/m   Wt Readings from Last 3 Encounters:  01/02/20 (!) 305 lb 8 oz (138.6 kg)    08/14/19 300 lb (136.1 kg)  02/22/19 296 lb (134.3 kg)      Physical Exam Vitals and nursing note reviewed.  Constitutional:      Appearance: Normal appearance. He is not ill-appearing.  Musculoskeletal:        General: Tenderness and signs of injury present. No swelling. Normal range of motion.     Right lower leg: No edema.     Left lower leg: No edema.     Comments:  2+ DP  FROM at toes  Skin:    General: Skin is warm and dry.     Findings: Wound present. No erythema or rash.     Comments: Puncture wound to mid L sore without surrounding erythema or drainage  Neurological:     Mental Status: He is alert.       Assessment & Plan:  This visit occurred during the SARS-CoV-2 public health emergency.  Safety protocols were in place, including screening questions prior to the visit, additional usage of staff PPE, and extensive cleaning of exam room while observing appropriate contact time as indicated for disinfecting solutions.   Problem List Items Addressed This Visit    Puncture wound of left foot - Primary    No obvious wound infection.  Td updated  today  Given size of thorn and depth of penetration, will provide with ppx levaquin 500mg  course x 3 days. Discussed watching for tendinopathy side effect.  Red flags to seek further care reviewed.       Relevant Orders   Td vaccine greater than or equal to 7yo preservative free IM (Completed)       Meds ordered this encounter  Medications  . levofloxacin (LEVAQUIN) 500 MG tablet    Sig: Take 1 tablet (500 mg total) by mouth daily.    Dispense:  3 tablet    Refill:  0   Orders Placed This Encounter  Procedures  . Td vaccine greater than or equal to 7yo preservative free IM    Patient instructions: Tetanus shot today (Td) I do recommend placing you on preventative antibiotic levaquin 500mg  daily for 3 days.  Watch for streaking redness , draining pus, or worsening pain/swelling/stiffness.   Follow up plan: No  follow-ups on file.  , MD

## 2020-01-02 NOTE — Assessment & Plan Note (Addendum)
No obvious wound infection.  Td updated today  Given size of thorn and depth of penetration, will provide with ppx levaquin 500mg  course x 3 days. Discussed watching for tendinopathy side effect.  Red flags to seek further care reviewed.

## 2020-01-03 ENCOUNTER — Telehealth: Payer: Self-pay | Admitting: Family Medicine

## 2020-01-03 DIAGNOSIS — Z1211 Encounter for screening for malignant neoplasm of colon: Secondary | ICD-10-CM

## 2020-01-03 NOTE — Addendum Note (Signed)
Addended by: [name] on: 01/03/2020 01:36 PM   Modules accepted: Orders

## 2020-01-03 NOTE — Telephone Encounter (Signed)
Spoke with pt asking about referral request.  States he talked with Dr. Reece Agar at CPE about getting colonoscopy.  FYI to Dr. Reece Agar and Geronimo Running.

## 2020-01-03 NOTE — Telephone Encounter (Signed)
Pt called in wanted to get a referral to [facility].   Please advise

## 2020-01-03 NOTE — Telephone Encounter (Signed)
Referral placed.

## 2020-02-21 ENCOUNTER — Other Ambulatory Visit: Payer: Self-pay | Admitting: Family Medicine

## 2020-05-06 ENCOUNTER — Other Ambulatory Visit: Payer: Self-pay

## 2020-05-06 ENCOUNTER — Other Ambulatory Visit
Admission: RE | Admit: 2020-05-06 | Discharge: 2020-05-06 | Disposition: A | Discharge status: Home / Self Care | Payer: 59 | Source: Ambulatory Visit | Attending: Internal Medicine | Admitting: Internal Medicine

## 2020-05-06 DIAGNOSIS — Z01812 Encounter for preprocedural laboratory examination: Secondary | ICD-10-CM | POA: Insufficient documentation

## 2020-05-06 DIAGNOSIS — Z20822 Contact with and (suspected) exposure to covid-19: Secondary | ICD-10-CM | POA: Insufficient documentation

## 2020-05-06 LAB — SARS CORONAVIRUS 2 (TAT 6-24 HRS): SARS Coronavirus 2: NEGATIVE

## 2020-05-08 ENCOUNTER — Encounter
Admission: RE | Disposition: A | Discharge status: Home / Self Care | Payer: Self-pay | Source: Home / Self Care | Attending: Internal Medicine

## 2020-05-08 ENCOUNTER — Encounter: Payer: Self-pay | Admitting: Internal Medicine

## 2020-05-08 ENCOUNTER — Ambulatory Visit
Admission: RE | Admit: 2020-05-08 | Discharge: 2020-05-08 | Disposition: A | Discharge status: Home / Self Care | Payer: 59 | Attending: Internal Medicine | Admitting: Internal Medicine

## 2020-05-08 ENCOUNTER — Ambulatory Visit: Payer: 59 | Admitting: Certified Registered Nurse Anesthetist

## 2020-05-08 DIAGNOSIS — Z888 Allergy status to other drugs, medicaments and biological substances status: Secondary | ICD-10-CM | POA: Insufficient documentation

## 2020-05-08 DIAGNOSIS — Z882 Allergy status to sulfonamides status: Secondary | ICD-10-CM | POA: Insufficient documentation

## 2020-05-08 DIAGNOSIS — Z79899 Other long term (current) drug therapy: Secondary | ICD-10-CM | POA: Diagnosis not present

## 2020-05-08 DIAGNOSIS — Z1211 Encounter for screening for malignant neoplasm of colon: Secondary | ICD-10-CM | POA: Diagnosis present

## 2020-05-08 DIAGNOSIS — Q438 Other specified congenital malformations of intestine: Secondary | ICD-10-CM | POA: Diagnosis not present

## 2020-05-08 HISTORY — PX: COLONOSCOPY WITH PROPOFOL: SHX5780

## 2020-05-08 SURGERY — COLONOSCOPY WITH PROPOFOL
Anesthesia: General

## 2020-05-08 MED ORDER — PROPOFOL 10 MG/ML IV BOLUS
INTRAVENOUS | Status: AC
  Filled 2020-05-08: qty 40

## 2020-05-08 MED ORDER — LIDOCAINE HCL (CARDIAC) PF 100 MG/5ML IV SOSY
PREFILLED_SYRINGE | INTRAVENOUS | Status: DC | PRN
  Administered 2020-05-08: 100 mg via INTRAVENOUS

## 2020-05-08 MED ORDER — SODIUM CHLORIDE 0.9 % IV SOLN
INTRAVENOUS | Status: DC
  Administered 2020-05-08: 1000 mL via INTRAVENOUS

## 2020-05-08 MED ORDER — PROPOFOL 500 MG/50ML IV EMUL
INTRAVENOUS | Status: DC | PRN
  Administered 2020-05-08: 100 ug/kg/min via INTRAVENOUS

## 2020-05-08 MED ORDER — PROPOFOL 10 MG/ML IV BOLUS
INTRAVENOUS | Status: DC | PRN
Start: 2020-05-08 — End: 2020-05-08
  Administered 2020-05-08 (×4): 20 mg via INTRAVENOUS

## 2020-05-08 NOTE — H&P (Signed)
Outpatient short stay form Pre-procedure 05/08/2020 11:29 AM Halsey Hammen K. Norma Fredrickson, M.D.  Primary Physician: [name], M.D.  Reason for visit: Colon cancer screening  History of present illness:  Patient presents for colonoscopy for colon cancer screening. The patient denies complaints of abdominal pain, significant change in bowel habits, or rectal bleeding.     No current facility-administered medications for this encounter.  Current Outpatient Medications:  Marland Kitchen  Multiple Vitamin (MULTIVITAMIN ADULT PO), Take by mouth daily., Disp: , Rfl:  .  fluticasone (FLONASE) 50 MCG/ACT nasal spray, SPRAY 2 SPRAYS INTO EACH NOSTRIL EVERY DAY, Disp: 16 mL, Rfl: 5 .  levofloxacin (LEVAQUIN) 500 MG tablet, Take 1 tablet (500 mg total) by mouth daily., Disp: 3 tablet, Rfl: 0 .  NON FORMULARY, prosta -strong prostate supplement, Disp: , Rfl:  .  Olopatadine HCl 0.2 % SOLN, Place 1 drop into both eyes daily., Disp: 2.5 mL, Rfl: 3 .  pramipexole (MIRAPEX) 0.125 MG tablet, Take 0.125 mg by mouth daily as needed., Disp: , Rfl:   No medications prior to admission.     Allergies  Allergen Reactions  . Flonase [Fluticasone Propionate] Other (See Comments)    Changed taste  . Sulfa Antibiotics Other (See Comments)    Rash and swelling with trouble breathing     Past Medical History:  Diagnosis Date  . Elevated BP    resolved with OSA treatment  . Headache(784.0)    sinus  . Heartburn    tums  . Horseshoe kidney 2014   found incidentally on CT abd for kidney stone  . Irregular heart rhythm   . Nephrolithiasis 2014   ca oxalate  . Obesity   . OSA on CPAP 2011  . Perennial allergic rhinitis    predominantly congestion  . Tinnitus    right ear  . Tobacco dipper quit 2015    Review of systems:  Otherwise negative.    Physical Exam  Gen: Alert, oriented. Appears stated age.  HEENT: [place]/AT. PERRLA. Lungs: CTA, no wheezes. CV: RR nl S1, S2. Abd: soft, benign, no masses. BS+ Ext:  No edema. Pulses 2+    Planned procedures: Proceed with colonoscopy. The patient understands the nature of the planned procedure, indications, risks, alternatives and potential complications including but not limited to bleeding, infection, perforation, damage to internal organs and possible oversedation/side effects from anesthesia. The patient agrees and gives consent to proceed.  Please refer to procedure notes for findings, recommendations and patient disposition/instructions.     Tarina Volk K. Norma Fredrickson, M.D. Gastroenterology 05/08/2020  11:29 AM

## 2020-05-08 NOTE — Anesthesia Preprocedure Evaluation (Addendum)
Anesthesia Evaluation  Patient identified by MRN, date of birth, ID band Patient awake    Reviewed: Allergy & Precautions, NPO status , Patient's Chart, lab work & pertinent test results  History of Anesthesia Complications Negative for: history of anesthetic complications  Airway Mallampati: III  TM Distance: >3 FB Neck ROM: Full   Comment: Large beard Dental no notable dental hx. (+) Teeth Intact   Pulmonary sleep apnea and Continuous Positive Airway Pressure Ventilation , neg COPD, Patient abstained from smoking.Not current smoker,    Pulmonary exam normal breath sounds clear to auscultation       Cardiovascular Exercise Tolerance: Good METS(-) hypertension(-) CAD and (-) Past MI negative cardio ROS  (-) dysrhythmias  Rhythm:Regular Rate:Normal - Systolic murmurs Stress echo 2016: Summary:  1. Left ventricle septal thickness is mildly increased.  2. The ejection fraction is estimated to be 60% at rest  3. The Right Ventricular Systolic Pressure is calculated at .  4. Intravenous contrast was used to enhance endocardial border  definition.  5. The patient achieved a level of 10 METS.  6. This is a negative electrocardiographic stress test.  7. This was a negative echocardiographic stress test to EF 75-80%     Neuro/Psych  Headaches, negative psych ROS   GI/Hepatic neg GERD  ,(+)     (-) substance abuse  ,   Endo/Other  neg diabetesMorbid obesity  Renal/GU negative Renal ROS     Musculoskeletal   Abdominal (+) + obese,   Peds  Hematology   Anesthesia Other Findings Past Medical History: No date: Elevated BP     Comment:  resolved with OSA treatment No date: Headache(784.0)     Comment:  sinus No date: Heartburn     Comment:  tums 2014: Horseshoe kidney     Comment:  found incidentally on CT abd for kidney stone No date: Irregular heart rhythm 2014: Nephrolithiasis     Comment:  ca oxalate No  date: Obesity 2011: OSA on CPAP No date: Perennial allergic rhinitis     Comment:  predominantly congestion No date: Tinnitus     Comment:  right ear quit 2015: Tobacco dipper  Reproductive/Obstetrics                            Anesthesia Physical Anesthesia Plan  ASA: II  Anesthesia Plan: General   Post-op Pain Management:    Induction: Intravenous  PONV Risk Score and Plan: 2 and Ondansetron, Propofol infusion and TIVA  Airway Management Planned: Nasal Cannula and Nasal CPAP  Additional Equipment: None  Intra-op Plan:   Post-operative Plan:   Informed Consent: I have reviewed the patients History and Physical, chart, labs and discussed the procedure including the risks, benefits and alternatives for the proposed anesthesia with the patient or authorized representative who has indicated his/her understanding and acceptance.     Dental advisory given  Plan Discussed with: CRNA and Surgeon  Anesthesia Plan Comments: (Discussed risks of anesthesia with patient, including possibility of difficulty with spontaneous ventilation under anesthesia necessitating airway intervention, PONV, and rare risks such as cardiac or respiratory or neurological events. Patient understands.)        Anesthesia Quick Evaluation

## 2020-05-08 NOTE — Op Note (Signed)
[facility] Patient Name: Danny Cox Procedure Date: 05/08/2020 1:14 PM MRN: [id number] Account #: [id number] Date of Birth: August 25, 1970 Admit Type: Outpatient Age: 50 Room: [facility] ENDO ROOM 2 Gender: Male Note Status: Finalized Procedure:             Colonoscopy Indications:           Screening for colorectal malignant neoplasm Providers:             Boykin Nearing. Norma Fredrickson MD, MD Referring MD:          [name] (Referring MD) Medicines:             Propofol per Anesthesia Complications:         No immediate complications. Procedure:             Pre-Anesthesia Assessment:                        - The risks and benefits of the procedure and the                         sedation options and risks were discussed with the                         patient. All questions were answered and informed                         consent was obtained.                        - Patient identification and proposed procedure were                         verified prior to the procedure by the nurse. The                         procedure was verified in the procedure room.                        - ASA Grade Assessment: III - A patient with severe                         systemic disease.                        - After reviewing the risks and benefits, the patient                         was deemed in satisfactory condition to undergo the                         procedure.                        After obtaining informed consent, the colonoscope was                         passed under direct vision. Throughout the procedure,                         the patient's blood pressure, pulse, and  oxygen                         saturations were monitored continuously. The                         Colonoscope was introduced through the anus and                         advanced to the the cecum, identified by appendiceal                         orifice and ileocecal valve. The  colonoscopy was                         somewhat difficult due to significant looping.                         Successful completion of the procedure was aided by                         straightening and shortening the scope to obtain bowel                         loop reduction. The patient tolerated the procedure                         well. The quality of the bowel preparation was good.                         The ileocecal valve, appendiceal orifice, and rectum                         were photographed. Findings:      The perianal and digital rectal examinations were normal. Pertinent       negatives include normal sphincter tone and no palpable rectal lesions.      The entire examined colon appeared normal on direct and retroflexion       views. Impression:            - The entire examined colon is normal on direct and                         retroflexion views.                        - No specimens collected. Recommendation:        - Patient has a contact number available for                         emergencies. The signs and symptoms of potential                         delayed complications were discussed with the patient.                         Return to normal activities tomorrow. Written  discharge instructions were provided to the patient.                        - Resume previous diet.                        - Continue present medications.                        - Repeat colonoscopy in 10 years for screening                         purposes.                        - Return to GI office PRN.                        - The findings and recommendations were discussed with                         the patient. Procedure Code(s):     --- Professional ---                        Y7829, Colorectal cancer screening; colonoscopy on                         individual not meeting criteria for high risk Diagnosis Code(s):     --- Professional ---                         Z12.11, Encounter for screening for malignant neoplasm                         of colon CPT copyright 2019 [employer]. All rights reserved. The codes documented in this report are preliminary and upon coder review may  be revised to meet current compliance requirements. Stanton Kidney MD, MD 05/08/2020 1:40:55 PM This report has been signed electronically. Number of Addenda: 0 Note Initiated On: 05/08/2020 1:14 PM Scope Withdrawal Time: 0 hours 8 minutes 22 seconds  Total Procedure Duration: 0 hours 15 minutes 16 seconds  Estimated Blood Loss:  Estimated blood loss: none.      [facility] [place]

## 2020-05-08 NOTE — Interval H&P Note (Signed)
History and Physical Interval Note:  05/08/2020 1:05 PM  CLEVLAND CORK  has presented today for surgery, with the diagnosis of SCREENING.  The various methods of treatment have been discussed with the patient and family. After consideration of risks, benefits and other options for treatment, the patient has consented to  Procedure(s): COLONOSCOPY WITH PROPOFOL (N/A) as a surgical intervention.  The patient's history has been reviewed, patient examined, no change in status, stable for surgery.  I have reviewed the patient's chart and labs.  Questions were answered to the patient's satisfaction.     [place], [place]

## 2020-05-08 NOTE — Transfer of Care (Signed)
Immediate Anesthesia Transfer of Care Note  Patient: Danny Cox  Procedure(s) Performed: COLONOSCOPY WITH PROPOFOL (N/A )  Patient Location: PACU  Anesthesia Type:General  Level of Consciousness: awake and alert   Airway & Oxygen Therapy: Patient Spontanous Breathing  Post-op Assessment: Report given to RN and Post -op Vital signs reviewed and stable  Post vital signs: Reviewed and stable  Last Vitals:  Vitals Value Taken Time  BP 114/66 05/08/20 1342  Temp    Pulse 81 05/08/20 1343  Resp 12 05/08/20 1343  SpO2 97 % 05/08/20 1343  Vitals shown include unvalidated device data.  Last Pain:  Vitals:   05/08/20 1238  TempSrc: Temporal  PainSc: 0-No pain         Complications: No complications documented.

## 2020-05-08 NOTE — Anesthesia Postprocedure Evaluation (Signed)
Anesthesia Post Note  Patient: Chayse Zatarain Crichlow  Procedure(s) Performed: COLONOSCOPY WITH PROPOFOL (N/A )  Patient location during evaluation: Endoscopy Anesthesia Type: General Level of consciousness: awake and alert Pain management: pain level controlled Vital Signs Assessment: post-procedure vital signs reviewed and stable Respiratory status: spontaneous breathing, nonlabored ventilation, respiratory function stable and patient connected to nasal cannula oxygen Cardiovascular status: blood pressure returned to baseline and stable Postop Assessment: no apparent nausea or vomiting Anesthetic complications: no   No complications documented.   Last Vitals:  Vitals:   05/08/20 1238 05/08/20 1343  BP: 125/79 114/66  Pulse: 71   Resp: 17   Temp: (!) 36.2 C 36.5 C  SpO2: 98%     Last Pain:  Vitals:   05/08/20 1343  TempSrc: Temporal  PainSc: 0-No pain                 Corinda Gubler

## 2020-05-09 ENCOUNTER — Encounter: Payer: Self-pay | Admitting: Internal Medicine

## 2020-05-25 ENCOUNTER — Encounter: Payer: Self-pay | Admitting: Intensive Care

## 2020-05-25 ENCOUNTER — Emergency Department: Payer: 59

## 2020-05-25 ENCOUNTER — Emergency Department
Admission: EM | Admit: 2020-05-25 | Discharge: 2020-05-25 | Disposition: A | Discharge status: Home / Self Care | Payer: 59 | Attending: Emergency Medicine | Admitting: Emergency Medicine

## 2020-05-25 ENCOUNTER — Other Ambulatory Visit: Payer: Self-pay

## 2020-05-25 DIAGNOSIS — S299XXA Unspecified injury of thorax, initial encounter: Secondary | ICD-10-CM | POA: Insufficient documentation

## 2020-05-25 DIAGNOSIS — S199XXA Unspecified injury of neck, initial encounter: Secondary | ICD-10-CM | POA: Diagnosis present

## 2020-05-25 DIAGNOSIS — S161XXA Strain of muscle, fascia and tendon at neck level, initial encounter: Secondary | ICD-10-CM | POA: Insufficient documentation

## 2020-05-25 DIAGNOSIS — R079 Chest pain, unspecified: Secondary | ICD-10-CM

## 2020-05-25 DIAGNOSIS — S0990XA Unspecified injury of head, initial encounter: Secondary | ICD-10-CM | POA: Insufficient documentation

## 2020-05-25 DIAGNOSIS — W28XXXA Contact with powered lawn mower, initial encounter: Secondary | ICD-10-CM | POA: Diagnosis not present

## 2020-05-25 DIAGNOSIS — S46911A Strain of unspecified muscle, fascia and tendon at shoulder and upper arm level, right arm, initial encounter: Secondary | ICD-10-CM | POA: Diagnosis not present

## 2020-05-25 DIAGNOSIS — R52 Pain, unspecified: Secondary | ICD-10-CM

## 2020-05-25 MED ORDER — CYCLOBENZAPRINE HCL 10 MG PO TABS
10.0000 mg | ORAL_TABLET | Freq: Once | ORAL | Status: AC
  Administered 2020-05-25: 10 mg via ORAL
  Filled 2020-05-25: qty 1

## 2020-05-25 MED ORDER — NAPROXEN 500 MG PO TABS: 500.0000 mg | ORAL_TABLET | Freq: Two times a day (BID) | ORAL | 0 refills | Status: DC

## 2020-05-25 MED ORDER — IBUPROFEN 600 MG PO TABS
600.0000 mg | ORAL_TABLET | Freq: Once | ORAL | Status: AC
  Administered 2020-05-25: 600 mg via ORAL
  Filled 2020-05-25: qty 1

## 2020-05-25 MED ORDER — TRAMADOL HCL 50 MG PO TABS: 50.0000 mg | ORAL_TABLET | Freq: Four times a day (QID) | ORAL | 0 refills | Status: DC | PRN

## 2020-05-25 MED ORDER — ORPHENADRINE CITRATE ER 100 MG PO TB12
100.0000 mg | ORAL_TABLET | Freq: Two times a day (BID) | ORAL | 0 refills | Status: DC
Start: 2020-05-25 — End: 2020-08-15

## 2020-05-25 MED ORDER — LIDOCAINE 5 % EX PTCH: 1.0000 | MEDICATED_PATCH | CUTANEOUS | Status: DC

## 2020-05-25 MED ORDER — TRAMADOL HCL 50 MG PO TABS
50.0000 mg | ORAL_TABLET | Freq: Once | ORAL | Status: AC
  Administered 2020-05-25: 50 mg via ORAL
  Filled 2020-05-25: qty 1

## 2020-05-25 MED ORDER — ORPHENADRINE CITRATE ER 100 MG PO TB12
100.0000 mg | ORAL_TABLET | Freq: Two times a day (BID) | ORAL | 0 refills | Status: DC
Start: 2020-05-25 — End: 2020-05-25

## 2020-05-25 NOTE — Discharge Instructions (Addendum)
CT of the head neck and chest did not reveal any acute injuries.  Right shoulder x-ray reveals a ligament strains of the [facility] joint.  Advised to wear arm sling for 5 to 7 days.  Follow discharge care instruction take medication as directed.

## 2020-05-25 NOTE — ED Triage Notes (Signed)
Pt arrived POV driven by brother.  States that he rolled lawn mower on top of self while mowing. Experiencing rt sided shoulder and  rib cage pain. Able to move all 4 extremities.
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
## 2020-08-15 ENCOUNTER — Other Ambulatory Visit: Payer: Self-pay | Admitting: Family Medicine

## 2020-08-15 ENCOUNTER — Other Ambulatory Visit: Payer: Self-pay

## 2020-08-15 ENCOUNTER — Other Ambulatory Visit (INDEPENDENT_AMBULATORY_CARE_PROVIDER_SITE_OTHER): Payer: 59

## 2020-08-15 DIAGNOSIS — Z1159 Encounter for screening for other viral diseases: Secondary | ICD-10-CM

## 2020-08-15 DIAGNOSIS — G2581 Restless legs syndrome: Secondary | ICD-10-CM

## 2020-08-15 DIAGNOSIS — E785 Hyperlipidemia, unspecified: Secondary | ICD-10-CM | POA: Diagnosis not present

## 2020-08-15 LAB — FERRITIN: Ferritin: 248.2 ng/mL (ref 22.0–322.0)

## 2020-08-15 LAB — IBC PANEL
Iron: 82 ug/dL (ref 42–165)
Saturation Ratios: 22.6 % (ref 20.0–50.0)
Transferrin: 259 mg/dL (ref 212.0–360.0)

## 2020-08-15 LAB — COMPREHENSIVE METABOLIC PANEL
ALT: 15 U/L (ref 0–53)
AST: 13 U/L (ref 0–37)
Albumin: 4.5 g/dL (ref 3.5–5.2)
Alkaline Phosphatase: 55 U/L (ref 39–117)
BUN: 18 mg/dL (ref 6–23)
CO2: 27 mEq/L (ref 19–32)
Calcium: 9.4 mg/dL (ref 8.4–10.5)
Chloride: 104 mEq/L (ref 96–112)
Creatinine, Ser: 0.92 mg/dL (ref 0.40–1.50)
GFR: 98.63 mL/min (ref >60.00)
Glucose, Bld: 105 mg/dL — ABNORMAL HIGH (ref 70–99)
Potassium: 4.4 mEq/L (ref 3.5–5.1)
Sodium: 138 mEq/L (ref 135–145)
Total Bilirubin: 0.6 mg/dL (ref 0.2–1.2)
Total Protein: 7.2 g/dL (ref 6.0–8.3)

## 2020-08-15 LAB — LIPID PANEL
Cholesterol: 228 mg/dL — ABNORMAL HIGH (ref 0–200)
HDL: 43.4 mg/dL (ref >39.00)
LDL Cholesterol: 164 mg/dL — ABNORMAL HIGH (ref 0–99)
NonHDL: 184.37
Total CHOL/HDL Ratio: 5
Triglycerides: 104 mg/dL (ref 0.0–149.0)
VLDL: 20.8 mg/dL (ref 0.0–40.0)

## 2020-08-16 LAB — HEPATITIS C ANTIBODY
Hepatitis C Ab: NONREACTIVE
SIGNAL TO CUT-OFF: 0.02 (ref <1.00)

## 2020-08-22 NOTE — Progress Notes (Signed)
Patient ID: Danny Cox, male    DOB: 20-Apr-1970, 50 y.o.   MRN: [id number]  This visit was conducted in person.  BP 120/84   Pulse 71   Temp 98 F (36.7 C) (Temporal)   Ht 6\' 1"  (1.854 m)   Wt (!) 303 lb 8 oz (137.7 kg)   SpO2 98%   BMI 40.04 kg/m    CC: CPE Subjective:   HPI: Danny Cox is a 50 y.o. male presenting on 08/23/2020 for Annual Exam   OSA on auto CPAP to setting of ~9-10. Previously saw Dr 10/24/2020 ENT, needs new referral for OSA.   RLS on mirapex PRN. Describes uncomfortable sensation in calves at night. Worse with caffeine.   Shoulder injury 05/2020 s/p shoulder separation followed by ortho    Preventative: COLONOSCOPY WITH PROPOFOL 05/08/2020 - WNL, rpt 10 yrs Danny Cox, [place], [place]) Declines flu shot COVID vaccine - declines as he had COVID 12/2018  Td 2006, Tdap 2016  Seat belt use discussed  Sunscreen use discussed. No changing moles on skin.  Non smoker - stopped dipping 11/2018.  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 Interim medical history since our last visit reviewed. Allergies and medications reviewed and updated. Outpatient Medications Prior to Visit  Medication Sig Dispense Refill   Multiple Vitamin (MULTIVITAMIN ADULT PO) Take by mouth daily.     NON FORMULARY prosta -strong prostate supplement     fluticasone (FLONASE) 50 MCG/ACT nasal spray SPRAY 2 SPRAYS INTO EACH NOSTRIL EVERY DAY 16 mL 5   mometasone (NASONEX) 50 MCG/ACT nasal spray Place 2 sprays into the nose daily.     Olopatadine HCl 0.2 % SOLN Place 1 drop into both eyes daily. 2.5 mL 3   pramipexole (MIRAPEX) 0.125  MG tablet Take 0.125 mg by mouth daily as needed.     No facility-administered medications prior to visit.     Per HPI unless specifically indicated in ROS section below Review of Systems  Constitutional:  Negative for activity change, appetite change, chills, fatigue, fever and unexpected weight change.  HENT:  Negative for hearing loss.   Eyes:  Negative for visual disturbance.  Respiratory:  Negative for cough, chest tightness, shortness of breath and wheezing.   Cardiovascular:  Negative for chest pain, palpitations and leg swelling.  Gastrointestinal:  Negative for abdominal distention, abdominal pain, blood in stool, constipation, diarrhea, nausea and vomiting.  Genitourinary:  Negative for difficulty urinating and hematuria.  Musculoskeletal:  Negative for arthralgias, myalgias and neck pain.  Skin:  Negative for rash.  Neurological:  Negative for dizziness, seizures, syncope and headaches.  Hematological:  Negative for adenopathy. Does not bruise/bleed easily.  Psychiatric/Behavioral:  Negative for dysphoric mood. The patient is not nervous/anxious.    Objective:  BP 120/84   Pulse 71   Temp 98 F (36.7 C) (Temporal)   Ht 6\' 1"  (1.854 m)   Wt (!) 303 lb 8 oz (137.7 kg)   SpO2 98%   BMI 40.04 kg/m   Wt Readings from Last 3 Encounters:  08/23/20 (!) 303  lb 8 oz (137.7 kg)  05/25/20 (!) 305 lb (138.3 kg)  05/08/20 (!) 305 lb (138.3 kg)      Physical Exam Vitals and nursing note reviewed.  Constitutional:      General: He is not in acute distress.    Appearance: Normal appearance. He is well-developed. He is not ill-appearing.  HENT:     Head: Normocephalic and atraumatic.     Right Ear: Hearing, tympanic membrane, ear canal and external ear normal.     Left Ear: Hearing, tympanic membrane, ear canal and external ear normal.  Eyes:     General: No scleral icterus.    Extraocular Movements: Extraocular movements intact.     Conjunctiva/sclera: Conjunctivae normal.      Pupils: Pupils are equal, round, and reactive to light.  Neck:     Thyroid: No thyroid mass or thyromegaly.  Cardiovascular:     Rate and Rhythm: Normal rate and regular rhythm.     Pulses: Normal pulses.          Radial pulses are 2+ on the right side and 2+ on the left side.     Heart sounds: Normal heart sounds. No murmur heard. Pulmonary:     Effort: Pulmonary effort is normal. No respiratory distress.     Breath sounds: Normal breath sounds. No wheezing, rhonchi or rales.  Abdominal:     General: Bowel sounds are normal. There is no distension.     Palpations: Abdomen is soft. There is no mass.     Tenderness: There is no abdominal tenderness. There is no guarding or rebound.     Hernia: No hernia is present.  Musculoskeletal:        General: Normal range of motion.     Cervical back: Normal range of motion and neck supple.     Right lower leg: No edema.     Left lower leg: No edema.  Lymphadenopathy:     Cervical: No cervical adenopathy.  Skin:    General: Skin is warm and dry.     Findings: No rash.  Neurological:     General: No focal deficit present.     Mental Status: He is alert and oriented to person, place, and time.  Psychiatric:        Mood and Affect: Mood normal.        Behavior: Behavior normal.        Thought Content: Thought content normal.        Judgment: Judgment normal.      Results for orders placed or performed in visit on 08/15/20  Ferritin  Result Value Ref Range   Ferritin 248.2 22.0 - 322.0 ng/mL  IBC panel  Result Value Ref Range   Iron 82 42 - 165 ug/dL   Transferrin 321.2 248.2 - 360.0 mg/dL   Saturation Ratios 50.0 20.0 - 50.0 %  Hepatitis C antibody  Result Value Ref Range   Hepatitis C Ab NON-REACTIVE NON-REACTIVE   SIGNAL TO CUT-OFF 0.02 <1.00  Comprehensive metabolic panel  Result Value Ref Range   Sodium 138 135 - 145 mEq/L   Potassium 4.4 3.5 - 5.1 mEq/L   Chloride 104 96 - 112 mEq/L   CO2 27 19 - 32 mEq/L   Glucose,  Bld 105 (H) 70 - 99 mg/dL   BUN 18 6 - 23 mg/dL   Creatinine, Ser 3.70 0.40 - 1.50 mg/dL   Total Bilirubin 0.6 0.2 - 1.2 mg/dL   Alkaline Phosphatase 55 39 -  117 U/L   AST 13 0 - 37 U/L   ALT 15 0 - 53 U/L   Total Protein 7.2 6.0 - 8.3 g/dL   Albumin 4.5 3.5 - 5.2 g/dL   GFR 50.53 >97.67 mL/min   Calcium 9.4 8.4 - 10.5 mg/dL  Lipid panel  Result Value Ref Range   Cholesterol 228 (H) 0 - 200 mg/dL   Triglycerides 341.9 0.0 - 149.0 mg/dL   HDL 37.90 >24.09 mg/dL   VLDL 73.5 0.0 - 32.9 mg/dL   LDL Cholesterol 924 (H) 0 - 99 mg/dL   Total CHOL/HDL Ratio 5    NonHDL 184.37     Assessment & Plan:  This visit occurred during the SARS-CoV-2 public health emergency.  Safety protocols were in place, including screening questions prior to the visit, additional usage of staff PPE, and extensive cleaning of exam room while observing appropriate contact time as indicated for disinfecting solutions.   Problem List Items Addressed This Visit     Perennial allergic rhinitis    Refilled nasonex and eye drops       Heartburn    No trouble recently       OSA on CPAP    Continues CPAP regularly.  Previously followed by ENT. Requests referral to [facility] to establish care.        Relevant Orders   Ambulatory referral to Pulmonology   Severe obesity (BMI 35.0-39.9) with comorbidity (HCC)    Encouraged healthy diet and lifestyle choices to affect sustainable weight loss. Watch portion sizes        Healthcare maintenance - Primary    Preventative protocols reviewed and updated unless pt declined. Discussed healthy diet and lifestyle.        Dyslipidemia    Chronic, stable. Discussed diet choices to affect improved cholesterol levels.  The 10-year ASCVD risk score Denman George DC Montez Hageman., et al., 2013) is: 3.6%   Values used to calculate the score:     Age: 53 years     Sex: Male     Is Non-Hispanic African American: No     Diabetic: No     Tobacco smoker: No     Systolic Blood  Pressure: 120 mmHg     Is BP treated: No     HDL Cholesterol: 43.4 mg/dL     Total Cholesterol: 228 mg/dL        RLS (restless legs syndrome)    Received mirapex through [facility] for RLS. Iron levels normal. Will refill mirapex - uses sparingly.          Meds ordered this encounter  Medications   pramipexole (MIRAPEX) 0.125 MG tablet    Sig: Take 1 tablet (0.125 mg total) by mouth daily as needed (RLS symptoms).    Dispense:  30 tablet    Refill:  6   Olopatadine HCl 0.2 % SOLN    Sig: Place 1 drop into both eyes daily.    Dispense:  2.5 mL    Refill:  6   mometasone (NASONEX) 50 MCG/ACT nasal spray    Sig: Place 2 sprays into the nose daily.    Dispense:  1 each    Refill:  11    Orders Placed This Encounter  Procedures   Ambulatory referral to Pulmonology    Referral Priority:   Routine    Referral Type:   Consultation    Referral Reason:   Specialty Services Required    Requested Specialty:   Pulmonary Disease  Number of Visits Requested:   1     Patient instructions: You are doing well today Return as needed or in 1 year for next physical.  Work on weight and exercise.   Follow up plan: Return in about 1 year (around 08/23/2021) for annual exam, prior fasting for blood work.  Eustaquio BoydenJavier Yexalen Deike, MD

## 2020-08-23 ENCOUNTER — Ambulatory Visit (INDEPENDENT_AMBULATORY_CARE_PROVIDER_SITE_OTHER): Payer: 59 | Admitting: Family Medicine

## 2020-08-23 ENCOUNTER — Encounter: Payer: Self-pay | Admitting: Family Medicine

## 2020-08-23 ENCOUNTER — Other Ambulatory Visit: Payer: Self-pay

## 2020-08-23 VITALS — BP 120/84 | HR 71 | Temp 98.0°F | Ht 73.0 in | Wt 303.5 lb

## 2020-08-23 DIAGNOSIS — G4733 Obstructive sleep apnea (adult) (pediatric): Secondary | ICD-10-CM

## 2020-08-23 DIAGNOSIS — R12 Heartburn: Secondary | ICD-10-CM

## 2020-08-23 DIAGNOSIS — Z0000 Encounter for general adult medical examination without abnormal findings: Secondary | ICD-10-CM

## 2020-08-23 DIAGNOSIS — E785 Hyperlipidemia, unspecified: Secondary | ICD-10-CM

## 2020-08-23 DIAGNOSIS — Z9989 Dependence on other enabling machines and devices: Secondary | ICD-10-CM

## 2020-08-23 DIAGNOSIS — J3089 Other allergic rhinitis: Secondary | ICD-10-CM | POA: Diagnosis not present

## 2020-08-23 DIAGNOSIS — G2581 Restless legs syndrome: Secondary | ICD-10-CM

## 2020-08-23 MED ORDER — MOMETASONE FUROATE 50 MCG/ACT NA SUSP: 2.0000 | Freq: Every day | NASAL | 11 refills | Status: DC

## 2020-08-23 MED ORDER — OLOPATADINE HCL 0.2 % OP SOLN: 1.0000 [drp] | Freq: Every day | OPHTHALMIC | 6 refills | Status: DC

## 2020-08-23 MED ORDER — PRAMIPEXOLE DIHYDROCHLORIDE 0.125 MG PO TABS: 0.1250 mg | ORAL_TABLET | Freq: Every day | ORAL | 6 refills | Status: DC | PRN

## 2020-08-23 NOTE — Assessment & Plan Note (Signed)
Continues CPAP regularly.  Previously followed by ENT. Requests referral to [facility] to establish care.

## 2020-08-23 NOTE — Patient Instructions (Addendum)
You are doing well today Return as needed or in 1 year for next physical.  Work on weight and exercise.   Health Maintenance, Male Adopting a healthy lifestyle and getting preventive care are important in promoting health and wellness. Ask your health care provider about: The right schedule for you to have regular tests and exams. Things you can do on your own to prevent diseases and keep yourself healthy. What should I know about diet, weight, and exercise? Eat a healthy diet  Eat a diet that includes plenty of vegetables, fruits, low-fat dairy products, and lean protein. Do not eat a lot of foods that are high in solid fats, added sugars, or sodium.  Maintain a healthy weight Body mass index (BMI) is a measurement that can be used to identify possible weight problems. It estimates body fat based on height and weight. Your health care provider can help determine your BMI and help you achieve or maintain ahealthy weight. Get regular exercise Get regular exercise. This is one of the most important things you can do for your health. Most adults should: Exercise for at least 150 minutes each week. The exercise should increase your heart rate and make you sweat (moderate-intensity exercise). Do strengthening exercises at least twice a week. This is in addition to the moderate-intensity exercise. Spend less time sitting. Even light physical activity can be beneficial. Watch cholesterol and blood lipids Have your blood tested for lipids and cholesterol at 48 years of age, then havethis test every 5 years. You may need to have your cholesterol levels checked more often if: Your lipid or cholesterol levels are high. You are older than 48 years of age. You are at high risk for heart disease. What should I know about cancer screening? Many types of cancers can be detected early and may often be prevented. Depending on your health history and family history, you may need to have cancer screening at  various ages. This may include screening for: Colorectal cancer. Prostate cancer. Skin cancer. Lung cancer. What should I know about heart disease, diabetes, and high blood pressure? Blood pressure and heart disease High blood pressure causes heart disease and increases the risk of stroke. This is more likely to develop in people who have high blood pressure readings, are of African descent, or are overweight. Talk with your health care provider about your target blood pressure readings. Have your blood pressure checked: Every 3-5 years if you are 40-51 years of age. Every year if you are 54 years old or older. If you are between the ages of 71 and 53 and are a current or former smoker, ask your health care provider if you should have a one-time screening for abdominal aortic aneurysm (AAA). Diabetes Have regular diabetes screenings. This checks your fasting blood sugar level. Have the screening done: Once every three years after age 1 if you are at a normal weight and have a low risk for diabetes. More often and at a younger age if you are overweight or have a high risk for diabetes. What should I know about preventing infection? Hepatitis B If you have a higher risk for hepatitis B, you should be screened for this virus. Talk with your health care provider to find out if you are at risk forhepatitis B infection. Hepatitis C Blood testing is recommended for: Everyone born from [birth year] through 1963. Anyone with known risk factors for hepatitis C. Sexually transmitted infections (STIs) You should be screened each year for STIs, including gonorrhea  and chlamydia, if: You are sexually active and are younger than 48 years of age. You are older than 48 years of age and your health care provider tells you that you are at risk for this type of infection. Your sexual activity has changed since you were last screened, and you are at increased risk for chlamydia or gonorrhea. Ask your health care  provider if you are at risk. Ask your health care provider about whether you are at high risk for HIV. Your health care provider may recommend a prescription medicine to help prevent HIV infection. If you choose to take medicine to prevent HIV, you should first get tested for HIV. You should then be tested every 3 months for as long as you are taking the medicine. Follow these instructions at home: Lifestyle Do not use any products that contain nicotine or tobacco, such as cigarettes, e-cigarettes, and chewing tobacco. If you need help quitting, ask your health care provider. Do not use street drugs. Do not share needles. Ask your health care provider for help if you need support or information about quitting drugs. Alcohol use Do not drink alcohol if your health care provider tells you not to drink. If you drink alcohol: Limit how much you have to 0-2 drinks a day. Be aware of how much alcohol is in your drink. In the [place] one drink equals one 12 oz bottle of beer (355 mL), one 5 oz glass of wine (148 mL), or one 1 oz glass of hard liquor (44 mL). General instructions Schedule regular health, dental, and eye exams. Stay current with your vaccines. Tell your health care provider if: You often feel depressed. You have ever been abused or do not feel safe at home. Summary Adopting a healthy lifestyle and getting preventive care are important in promoting health and wellness. Follow your health care provider's instructions about healthy diet, exercising, and getting tested or screened for diseases. Follow your health care provider's instructions on monitoring your cholesterol and blood pressure. This information is not intended to replace advice given to you by your health care provider. Make sure you discuss any questions you have with your healthcare provider. Document Revised: 01/26/2018 Document Reviewed: 01/26/2018 Elsevier Patient Education  2022 [employer].

## 2020-08-23 NOTE — Assessment & Plan Note (Signed)
Encouraged healthy diet and lifestyle choices to affect sustainable weight loss. Watch portion sizes

## 2020-08-23 NOTE — Assessment & Plan Note (Signed)
No trouble recently 

## 2020-08-23 NOTE — Assessment & Plan Note (Signed)
Refilled nasonex and eye drops 

## 2020-08-23 NOTE — Assessment & Plan Note (Signed)
Received mirapex through [facility] for RLS. Iron levels normal. Will refill mirapex - uses sparingly.

## 2020-08-23 NOTE — Assessment & Plan Note (Signed)
Chronic, stable. Discussed diet choices to affect improved cholesterol levels.  The 10-year ASCVD risk score Denman George DC Montez Hageman., et al., 2013) is: 3.6%   Values used to calculate the score:     Age: 50 years     Sex: Male     Is Non-Hispanic African American: No     Diabetic: No     Tobacco smoker: No     Systolic Blood Pressure: 120 mmHg     Is BP treated: No     HDL Cholesterol: 43.4 mg/dL     Total Cholesterol: 228 mg/dL

## 2020-08-23 NOTE — Assessment & Plan Note (Signed)
Preventative protocols reviewed and updated unless pt declined. Discussed healthy diet and lifestyle.  

## 2020-11-13 ENCOUNTER — Telehealth: Payer: Self-pay

## 2020-11-13 NOTE — Telephone Encounter (Signed)
Called and spoke to patient , patient will be bringing sd card in office tomorrow to get a download. Nothing further needed.

## 2020-11-14 ENCOUNTER — Encounter: Payer: Self-pay | Admitting: Pulmonary Disease

## 2020-11-14 ENCOUNTER — Other Ambulatory Visit: Payer: Self-pay

## 2020-11-14 ENCOUNTER — Ambulatory Visit: Payer: 59 | Admitting: Pulmonary Disease

## 2020-11-14 VITALS — BP 130/72 | HR 87 | Temp 98.7°F | Ht 73.0 in | Wt 310.6 lb

## 2020-11-14 DIAGNOSIS — G4733 Obstructive sleep apnea (adult) (pediatric): Secondary | ICD-10-CM | POA: Diagnosis not present

## 2020-11-14 DIAGNOSIS — J3089 Other allergic rhinitis: Secondary | ICD-10-CM | POA: Diagnosis not present

## 2020-11-14 MED ORDER — AZELASTINE HCL 0.15 % NA SOLN: 1.0000 | Freq: Two times a day (BID) | NASAL | Status: DC

## 2020-11-14 NOTE — Progress Notes (Signed)
[facility]  Chief Complaint  Patient presents with   Follow-up    Wearing cpap avg 8hr nightly. Feels pressure and mask is okay. [id number].     Past Surgical History:  He  has a past surgical history that includes Tonsillectomy and adenoidectomy (02/16/1982) and Colonoscopy with propofol (N/A, 05/08/2020).  Past Medical History:  Horseshoe kidney, Nephrolithiasis, Allergies, RLS, Bruxism, GERD  Constitutional:  BP 130/72 (BP Location: Left Arm, Cuff Size: Large)   Pulse 87   Temp 98.7 F (37.1 C) (Temporal)   Ht 6\' 1"  (1.854 m)   Wt (!) 310 lb 9.6 oz (140.9 kg)   SpO2 100%   BMI 40.98 kg/m   Brief Summary:  Danny Cox is a 50 y.o. male with obstructive sleep apnea.  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      Subjective:   He was previously followed by Dr. Airline pilot with [facility].  He was originally diagnosed with sleep apnea in 2011.  His most recent sleep study was in February 2015 that showed very severe sleep apnea.  He was started on auto CPAP.  Note from October 2021 indicates he had an average AHI of 1 with median CPAP 8 cm H2O and 95 th percentile CPAP 12 cm H2O.  He got a new machine about a year ago.  Uses [employer] for his DME.  He uses mirapex a couple times a month for leg symptoms.  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  Wasn't able to tolerate mouthguard.  Not as bad since he has been on CPAP.  Was getting confusional arousals, but also resolved with CPAP.  He goes to sleep at 9 pm.  He falls asleep 30 minutes.  He wakes up some times to use the bathroom.  He gets out of bed at 6 am.  He feels good in the morning.  He denies morning headache.  He does not use anything to help him fall sleep or stay awake.  He denies sleep walking, sleep talking, bruxism, or nightmares.  There is no history of restless legs.  He denies sleep hallucinations, sleep paralysis, or  cataplexy.  The Epworth score is 1 out of 24.   Physical Exam:   Appearance - well kempt   ENMT - no sinus tenderness, no oral exudate, no LAN, Mallampati 4 airway, no stridor  Respiratory - equal breath sounds bilaterally, no wheezing or rales  CV - s1s2 regular rate and rhythm, no murmurs  Ext - no clubbing, no edema  Skin - no rashes  Psych - normal mood and affect   Sleep Tests:  PSG 03/23/13 >> AHI 101.5, SpO2 low 70% Auto CPAP 10/15/20 to 11/13/20 >> used on 30 of 30 nights with average 8 hrs 11 min.  Average AHI 0.9 with median CPAP 9 and 95 th percentile CPAP 13 cm H2O  Social History:  He  reports that he has never smoked. He has quit using smokeless tobacco.  His smokeless tobacco use included snuff. He reports current alcohol use. He reports that he does not use drugs.  Family History:  His family history includes CAD (age of onset: 58) in his maternal uncle; Diabetes in his paternal grandfather; Prostatitis in his father; Stroke in his paternal grandfather.     Assessment/Plan:   Obstructive sleep apnea. 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 - advised him to use nasal irrigation, nasonex, and astepro  Restless leg syndrome. - mild - prn mirapex; gets refills from his PCP  Time Spent Involved in Patient Care on Day of Examination:  32 minutes  Follow up:   Patient Instructions  Follow up in 1 year  Medication List:   Allergies as of 11/14/2020       Reactions   Flonase [fluticasone Propionate] Other (See Comments)   Changed taste   Sulfa Antibiotics Other (See Comments)   Rash and swelling with trouble breathing        Medication List        Accurate as of November 14, 2020 11:28 AM. If you have any questions, ask your nurse or doctor.          Azelastine HCl 0.15 % Soln Commonly known  as: Astepro Place 1 spray into the nose in the morning and at bedtime. Started by: Coralyn Helling, MD   mometasone 50 MCG/ACT nasal spray Commonly known as: NASONEX Place 2 sprays into the nose daily.   MULTIVITAMIN ADULT PO Take by mouth daily.   NON FORMULARY prosta -strong prostate supplement   Olopatadine HCl 0.2 % Soln Place 1 drop into both eyes daily.   pramipexole 0.125 MG tablet Commonly known as: MIRAPEX Take 1 tablet (0.125 mg total) by mouth daily as needed (RLS symptoms).        Signature:  Coralyn Helling, MD [facility]/Critical Care Pager - [phone] 11/14/2020, 11:28 AM

## 2020-11-14 NOTE — Patient Instructions (Signed)
Follow up in 1 year.

## 2020-12-25 ENCOUNTER — Ambulatory Visit: Payer: 59 | Admitting: Family Medicine

## 2020-12-25 ENCOUNTER — Telehealth: Payer: Self-pay

## 2020-12-25 ENCOUNTER — Other Ambulatory Visit: Payer: Self-pay

## 2020-12-25 ENCOUNTER — Encounter: Payer: Self-pay | Admitting: Family Medicine

## 2020-12-25 VITALS — BP 130/78 | HR 92 | Temp 97.3°F | Ht 73.0 in | Wt 312.4 lb

## 2020-12-25 DIAGNOSIS — M898X1 Other specified disorders of bone, shoulder: Secondary | ICD-10-CM

## 2020-12-25 DIAGNOSIS — R079 Chest pain, unspecified: Secondary | ICD-10-CM

## 2020-12-25 DIAGNOSIS — M5412 Radiculopathy, cervical region: Secondary | ICD-10-CM

## 2020-12-25 NOTE — Telephone Encounter (Signed)
Per appt notes pt already has appt with Dr Patsy Lager 12/25/20 at 2:40. Per access nurse note pt was given instructions if symptoms worsen. Sending note to Dr Windell Moulding CMA and Dr Reece Agar as Lorain Childes to PCP.

## 2020-12-25 NOTE — Telephone Encounter (Signed)
[facility] - Client TELEPHONE ADVICE RECORD Access[facility]urse Patient Name: Danny Cox [facility] Gender: Male DOB: 1970/12/15 Age: 50 Y 5 M 23 D Return Phone Number: [phone] (Primary) Address: City/ State/ Zip: [place]  [address] Client [facility] Day - Client Client Site [facility] - Day Physician [name] - MD Contact Type Call Who Is Calling Patient / Member / Family / Caregiver Call Type Triage / Clinical Relationship To Patient Self Return Phone Number [phone] (Primary) Chief Complaint NUMBNESS/TINGLING- sudden on one side of the body or face Reason for Call Symptomatic / Request for Health Information Initial Comment Caller states he has numbness of his left side when he wakes up and pain in chest. Translation No Nurse Assessment Nurse: Lorin Picket, RN, Elnita Maxwell Date/Time (Eastern Time): 12/25/2020 10:59:40 AM Confirm and document reason for call. If symptomatic, describe symptoms. ---Caller states he has numbness for the last week in L elbow and L hand, will have a numbness and tingling when he wakes up and it will go away after his shower, has a dull pain in his chest on L side that started a week ago, pain comes and goes, is having pain now that started right after breakfast, rates pain less than 1/10-, denies other symptoms, Does the patient have any new or worsening symptoms? ---Yes Will a triage be completed? ---Yes Related visit to physician within the last 2 weeks? ---Yes Does the PT have any chronic conditions? (i.e. diabetes, asthma, this includes High risk factors for pregnancy, etc.) ---Yes List chronic conditions. ---Sleep apnea Is this a behavioral health or substance abuse call? ---No Guidelines Guideline Title Affirmed Question Affirmed Notes Nurse Date/Time [name] Time) Chest Pain [1] Chest pain lasts < 5 minutes AND [2] NO chest pain or cardiac  symptoms (e.g., breathing difficulty, sweating) Lorin Picket, RN, Elnita Maxwell 12/25/2020 11:04:06 AM PLEASE NOTE: All timestamps contained within this report are represented as [place] Standard Time. CONFIDENTIALTY NOTICE: This fax transmission is intended only for the addressee. It contains information that is legally privileged, confidential or otherwise protected from use or disclosure. If you are not the intended recipient, you are strictly prohibited from reviewing, disclosing, copying using or disseminating any of this information or taking any action in reliance on or regarding this information. If you have received this fax in error, please notify us immediately by telephone so that we can arrange for its return to [place]. Phone: [phone], Toll-Free: [phone], Fax: [phone] Page: 2 of 2 Call Id: [id number] Guidelines Guideline Title Affirmed Question Affirmed Notes Nurse Date/Time [name] Time) now (Exception: chest pains that last only a few seconds) Neurologic Deficit [1] Numbness (i.e., loss of sensation) of the face, arm / hand, or leg / foot on one side of the body AND [2] gradual onset (e.g., days to weeks) AND [3] present now Boswell, RN, Elnita Maxwell 12/25/2020 11:08:27 AM Disp. Time [name] Time) Disposition Final User 12/25/2020 10:56:07 AM Send to Urgent Gilford Silvius 12/25/2020 11:08:05 AM See PCP within 24 Hours Scott, RN, Elnita Maxwell 12/25/2020 11:20:36 AM See HCP within 4 Hours (or PCP triage) Yes Lorin Picket, RN, [name] Disagree/Comply Comply Caller Understands Yes PreDisposition Call Doctor Care Advice Given Per Guideline SEE PCP WITHIN 24 HOURS: * IF OFFICE WILL BE OPEN: You need to be examined within the next 24 hours. Call your doctor (or NP/PA) when the office opens and make an appointment. CALL BACK IF: * Difficulty breathing occurs * Chest pain increases in frequency,  duration or severity * Chest pain lasts over 5 minutes * You become worse SEE HCP (OR PCP  TRIAGE) WITHIN 4 HOURS: * IF OFFICE WILL BE OPEN: You need to be seen within the next 3 or 4 hours. Call your doctor (or NP/PA) now or as soon as the office opens. CALL BACK IF: * You become worse Comments User: Dorris Fetch, RN Date/Time [name] Time): 12/25/2020 11:21:43 AM Office closed, pt requested to be seen athe the [facility], transferred caller to [facility] for appt, Referrals REFERRED TO PCP OFFIC

## 2020-12-25 NOTE — Progress Notes (Signed)
Danny Alexa T. Danny Boback, MD, CAQ Sports Medicine [facility] at [facility] [address] [place], [address]  Phone: [phone]  FAX: [phone]  Danny Cox - 50 y.o. male  MRN [id number]  Date of Birth: 1970/10/04  Date: 12/25/2020  PCP: [name], MD  Referral: [name], MD  Chief Complaint  Patient presents with   Numbness    Left Arm when waking up   Nerve Pain   Chest Pain    This visit occurred during the SARS-CoV-2 public health emergency.  Safety protocols were in place, including screening questions prior to the visit, additional usage of staff PPE, and extensive cleaning of exam room while observing appropriate contact time as indicated for disinfecting solutions.   Subjective:   Danny Cox is a 50 y.o. very pleasant male patient with Body mass index is 41.21 kg/m. who presents with the following:  Is a very pleasant gentleman 50, and he presents with some pain in the left shoulder blade as well as some radicular symptoms in the left hand.  He also has had some numbness on the left side.  This only occurs with certain positions.  He also describes some intermittent chest pain in the same region anterior to the shoulder blade.  He is very active at work, he is not able to induce any kind of chest pain, shortness of breath, dyspnea on exertion with any kind of heavy work.  He does work from [employer], and there are times that he does not exert quite exertional.  This will come and go, and it can last various times, but it is induced while in bed predominantly. Dull and comes and goes.    Worse with lying down.  None with a long broken gait.  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   L hand on top with pain anterior and on the top.  Under the shoulder blade.   No CVD or CAD No smoker.  No hard drugs.  No family history mom, dad, sibs of CAD.   OSA Morbid obesity Lipids high  Review of Systems is noted  in the HPI, as appropriate  DG Shoulder Right  Result Date: 05/25/2020 CLINICAL DATA:  Injury. EXAM: RIGHT SHOULDER - 2+ VIEW COMPARISON:  None. FINDINGS: There is no evidence of fracture or dislocation of the shoulder. There is no evidence of arthropathy or other focal bone abnormality. Increased acromioclavicular interval, raising the question about ligamentous injury. Soft tissues are unremarkable. IMPRESSION: 1. No acute fracture or dislocation identified about the right shoulder. 2. Increased acromioclavicular interval, raising the question about ligamentous injury. Electronically Signed   By: Ted Mcalpine M.D.   On: 05/25/2020 17:39   CT Cervical Spine Wo Contrast  Result Date: 05/25/2020 CLINICAL DATA:  Lawnmower rolled on top of him while mowing the grass. EXAM: CT HEAD WITHOUT CONTRAST CT CERVICAL SPINE WITHOUT CONTRAST TECHNIQUE: Multidetector CT imaging of the head and cervical spine was performed following the standard protocol without intravenous contrast. Multiplanar CT image reconstructions of the cervical spine were also generated. COMPARISON:  CT head and cervical spine dated January 09, 2019. FINDINGS: CT HEAD FINDINGS Brain: No evidence of acute infarction, hemorrhage, hydrocephalus, extra-axial collection or mass lesion/mass effect. Vascular: No hyperdense vessel or unexpected calcification. Skull: Normal. Negative for fracture or focal lesion. Sinuses/Orbits: No acute finding.  Chronic paranasal sinus disease. Other: None. CT CERVICAL SPINE FINDINGS Alignment: No traumatic malalignment. Mild reversal of the normal cervical lordosis. Skull base  and vertebrae: No acute fracture. No primary bone lesion or focal pathologic process. Soft tissues and spinal canal: No prevertebral fluid or swelling. No visible canal hematoma. Disc levels: Unchanged mild-to-moderate disc height loss and facet uncovertebral hypertrophy from C5-C6 through C7-T1. Upper chest: Negative. Other: None.  IMPRESSION: 1. No acute intracranial abnormality. 2. No acute cervical spine fracture or traumatic listhesis. 3. Unchanged mild-to-moderate cervical spondylosis. Electronically Signed   By: Obie Dredge M.D.   On: 05/25/2020 18:03   Objective:   BP 130/78   Pulse 92   Temp (!) 97.3 F (36.3 C) (Temporal)   Ht 6\' 1"  (1.854 m)   Wt (!) 312 lb 6 oz (141.7 kg)   SpO2 97%   BMI 41.21 kg/m   GEN: No acute distress; alert,appropriate. PULM: Breathing comfortably in no respiratory distress PSYCH: Normally interactive.    Shoulder: B Inspection: No muscle wasting or winging Ecchymosis/edema: neg  AC joint, scapula, clavicle: NT Abduction: full, 5/5 Flexion: full, 5/5 IR, full, lift-off: 5/5 ER at neutral: full, 5/5 AC crossover and compression: neg Neer: neg Hawkins: neg Drop Test: neg Empty Can: neg Supraspinatus insertion: NT Bicipital groove: NT Speed's: neg Yergason's: neg Sulcus sign: neg Scapular dyskinesis:non C5-T1 intact Sensation intact Grip 5/5   I aggressively palpated the entirety of the patient shoulder girdle including upper trapezius, middle trapezius, rhomboid, serratus musculature and this was entirely nontender.  I will also stretched the patient's pectoralis major and minor extensively.  No pain with any aggressive rib manipulation as well.  I was unable to elicit muscular pain despite trying to move the shoulder girdle and almost every possible way.  He has no significant tenderness on the flanks or with deep compression of the obliques.  CERVICAL SPINE EXAM Range of motion: Flexion, extension, lateral bending, and rotation: Modest restriction of motion Pain with terminal motion: Mild Spinous Processes: NT SCM: NT Upper paracervical muscles: Mildly tender Upper traps: NT  While lying down and positioning in his arms, the patient is immediately able to induce similar symptoms with pain in the shoulder blade and radicular symptoms in numbness and  tingling in his arm.  This is the only time where he is able to elicit symptoms.  Results for orders placed or performed in visit on 08/15/20  Ferritin  Result Value Ref Range   Ferritin 248.2 22.0 - 322.0 ng/mL  IBC panel  Result Value Ref Range   Iron 82 42 - 165 ug/dL   Transferrin 08/17/20 016.0 - 360.0 mg/dL   Saturation Ratios 109.3 20.0 - 50.0 %  Hepatitis C antibody  Result Value Ref Range   Hepatitis C Ab NON-REACTIVE NON-REACTIVE   SIGNAL TO CUT-OFF 0.02 <1.00  Comprehensive metabolic panel  Result Value Ref Range   Sodium 138 135 - 145 mEq/L   Potassium 4.4 3.5 - 5.1 mEq/L   Chloride 104 96 - 112 mEq/L   CO2 27 19 - 32 mEq/L   Glucose, Bld 105 (H) 70 - 99 mg/dL   BUN 18 6 - 23 mg/dL   Creatinine, Ser 23.5 0.40 - 1.50 mg/dL   Total Bilirubin 0.6 0.2 - 1.2 mg/dL   Alkaline Phosphatase 55 39 - 117 U/L   AST 13 0 - 37 U/L   ALT 15 0 - 53 U/L   Total Protein 7.2 6.0 - 8.3 g/dL   Albumin 4.5 3.5 - 5.2 g/dL   GFR 5.73 22.02 mL/min   Calcium 9.4 8.4 - 10.5 mg/dL  Lipid panel  Result Value Ref Range   Cholesterol 228 (H) 0 - 200 mg/dL   Triglycerides 374.8 0.0 - 149.0 mg/dL   HDL 27.07 >86.75 mg/dL   VLDL 44.9 0.0 - 20.1 mg/dL   LDL Cholesterol 007 (H) 0 - 99 mg/dL   Total CHOL/HDL Ratio 5    NonHDL 184.37      Assessment and Plan:     ICD-10-CM   1. Left cervical radiculopathy  M54.12     2. Chest pain in adult  R07.9 EKG 12-Lead    3. Shoulder blade pain  M89.8X1      Total encounter time: 30 minutes. This includes total time spent on the day of encounter.  This includes chart review.  EKG: Normal sinus rhythm. Normal axis, normal R wave progression, No acute ST elevation or depression.   I personally reviewed the patient's x-rays, he has no significant glenohumeral arthritis. 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  Not muscular.  He is able to reduce symptoms with  manipulation of position causing acute recreation of symptoms by positioning the arm and neck.  Foraminal encroachment upon nerve and positional.  I do not see how this could possibly be related to his heart.  At this point, we reviewed variations in positional technique while sleeping, and at home he has tried this with relief of symptoms.  I would prefer not to have him go on any medication.  If symptoms persist then physical therapy would be a good option.  He has seen a local chiropractor before, and this is also reasonable.  We did review some McKenzie protocol cervical spine protocol for home.   There are no discontinued medications. Orders Placed This Encounter  Procedures   EKG 12-Lead    Follow-up: No follow-ups on file.  Dragon [employer] One speech-to-text software was used for transcription in this dictation.  Possible transcriptional errors can occur using Animal nutritionist.   Signed,  Elpidio Galea. Jermanie Minshall, MD   Outpatient Encounter Medications as of 12/25/2020  Medication Sig   Azelastine HCl (ASTEPRO) 0.15 % SOLN Place 1 spray into the nose in the morning and at bedtime.   mometasone (NASONEX) 50 MCG/ACT nasal spray Place 2 sprays into the nose daily.   Multiple Vitamin (MULTIVITAMIN ADULT PO) Take by mouth daily.   NON FORMULARY prosta -strong prostate supplement   Olopatadine HCl 0.2 % SOLN Place 1 drop into both eyes daily.   pramipexole (MIRAPEX) 0.125 MG tablet Take 1 tablet (0.125 mg total) by mouth daily as needed (RLS symptoms).   No facility-administered encounter medications on file as of 12/25/2020.

## 2020-12-26 NOTE — Telephone Encounter (Signed)
Appreciate Dr C seeing pt.

## 2020-12-30 ENCOUNTER — Encounter: Payer: Self-pay | Admitting: Family Medicine

## 2020-12-30 ENCOUNTER — Ambulatory Visit (INDEPENDENT_AMBULATORY_CARE_PROVIDER_SITE_OTHER)
Admission: RE | Admit: 2020-12-30 | Discharge: 2020-12-30 | Disposition: A | Discharge status: Home / Self Care | Payer: 59 | Source: Ambulatory Visit | Attending: Family Medicine | Admitting: Family Medicine

## 2020-12-30 ENCOUNTER — Ambulatory Visit: Payer: 59 | Admitting: Family Medicine

## 2020-12-30 ENCOUNTER — Other Ambulatory Visit: Payer: Self-pay

## 2020-12-30 VITALS — BP 136/82 | HR 80 | Temp 97.8°F | Ht 73.0 in | Wt 306.2 lb

## 2020-12-30 DIAGNOSIS — M5412 Radiculopathy, cervical region: Secondary | ICD-10-CM | POA: Insufficient documentation

## 2020-12-30 DIAGNOSIS — R079 Chest pain, unspecified: Secondary | ICD-10-CM | POA: Insufficient documentation

## 2020-12-30 LAB — CBC WITH DIFFERENTIAL/PLATELET
Basophils Absolute: 0.1 10*3/uL (ref 0.0–0.1)
Basophils Relative: 0.7 % (ref 0.0–3.0)
Eosinophils Absolute: 0.3 10*3/uL (ref 0.0–0.7)
Eosinophils Relative: 3.3 % (ref 0.0–5.0)
HCT: 45.7 % (ref 39.0–52.0)
Hemoglobin: 15.4 g/dL (ref 13.0–17.0)
Lymphocytes Relative: 26.3 % (ref 12.0–46.0)
Lymphs Abs: 2.1 10*3/uL (ref 0.7–4.0)
MCHC: 33.8 g/dL (ref 30.0–36.0)
MCV: 85.3 fl (ref 78.0–100.0)
Monocytes Absolute: 0.6 10*3/uL (ref 0.1–1.0)
Monocytes Relative: 7.5 % (ref 3.0–12.0)
Neutro Abs: 5.1 10*3/uL (ref 1.4–7.7)
Neutrophils Relative %: 62.2 % (ref 43.0–77.0)
Platelets: 277 10*3/uL (ref 150.0–400.0)
RBC: 5.35 Mil/uL (ref 4.22–5.81)
RDW: 13.5 % (ref 11.5–15.5)
WBC: 8.1 10*3/uL (ref 4.0–10.5)

## 2020-12-30 LAB — BASIC METABOLIC PANEL
BUN: 18 mg/dL (ref 6–23)
CO2: 26 mEq/L (ref 19–32)
Calcium: 9.8 mg/dL (ref 8.4–10.5)
Chloride: 101 mEq/L (ref 96–112)
Creatinine, Ser: 0.92 mg/dL (ref 0.40–1.50)
GFR: 98.37 mL/min (ref >60.00)
Glucose, Bld: 93 mg/dL (ref 70–99)
Potassium: 4.6 mEq/L (ref 3.5–5.1)
Sodium: 136 mEq/L (ref 135–145)

## 2020-12-30 LAB — TROPONIN I (HIGH SENSITIVITY): High Sens Troponin I: 3 ng/L (ref 2–17)

## 2020-12-30 LAB — TSH: TSH: 1.35 u[IU]/mL (ref 0.35–5.50)

## 2020-12-30 NOTE — Patient Instructions (Addendum)
Chest xray today  EKG today  Labs today  Reproducible chest wall pain sounds like a rib strain  We will refer you to cardiology for further evaluation.  In the meantime, start taking tylenol 500mg  three times daily with meals, use heating pad to left chest wall.

## 2020-12-30 NOTE — Assessment & Plan Note (Addendum)
Describes 2 separate chest pains - 1. Substernal to left sided pressure tightness worse with exertion, improved with rest - concerning for cardiac cause - check labs, CXR, EKG and refer to cardiology for further evaluation. In interim he will start 162mg  aspirin daily. 2. Sharp left sided chest wall pain that is reproducible and has pleuritic component - anticipate MSK ?ribcage strain. Treat with tylenol and heating pad. Check CXR, D dimer.

## 2020-12-30 NOTE — Progress Notes (Addendum)
Patient ID: SILVANO GAROFANO, male    DOB: 05/31/1970, 50 y.o.   MRN: [id number]  This visit was conducted in person.  BP 136/82   Pulse 80   Temp 97.8 F (36.6 C) (Temporal)   Ht 6\' 1"  (1.854 m)   Wt (!) 306 lb 4 oz (138.9 kg)   SpO2 97%   BMI 40.40 kg/m    CC: chest pain Subjective:   HPI: CAM DAUPHIN is a 50 y.o. male presenting on 12/30/2020 for Chest Pain (C/o chest pain worsening but shoulder pain and left side numbness has improved. Seen recently by Dr. 01/01/2021 for sam sxs. )   Saw Dr Patsy Lager last week, note reviewed. Presented with L shoulder pain with radiation down to L hand as well as L side numbness. Dx L cervical radiculopathy treated with variations in sleep positions/exercises. EKG at that time normal.   Exercises have significantly helped L neck/shoulder pain.  However chest pain symptoms are worsen. 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 Notes reproducible left sided chest pain - triggers are deep breathing, palpation, bending over or laying down. Denies inciting trauma/injury.   Very active at work without any previously inducible chest pain.   Known OSA on CPAP as well as RLS on mirapex PRN. Known HLD, not on statin.  Lab Results  Component Value Date   CHOL 228 (H) 08/15/2020   HDL 43.40 08/15/2020   LDLCALC 164 (H) 08/15/2020   LDLDIRECT 127.0 07/06/2016   TRIG 104.0 08/15/2020   CHOLHDL 5 08/15/2020    The 10-year ASCVD risk score (Arnett DK, et al., 2019) is: 4.5%   Values used to calculate the score:     Age: 74 years     Sex: Male     Is Non-Hispanic African American: No     Diabetic: No     Tobacco smoker: No     Systolic Blood Pressure: 136 mmHg     Is BP treated: No     HDL Cholesterol: 43.4 mg/dL     Total Cholesterol: 228 mg/dL  Shoulder injury 54 s/p shoulder separation followed by ortho.   No known personal h/o CAD. Maternal uncle  with MI age 49yo.  Mother with h/o hemorrhagic stroke.      Relevant past medical, surgical, family and social history reviewed and updated as indicated. Interim medical history since our last visit reviewed. Allergies and medications reviewed and updated. Outpatient Medications Prior to Visit  Medication Sig Dispense Refill   Azelastine HCl (ASTEPRO) 0.15 % SOLN Place 1 spray into the nose in the morning and at bedtime.     mometasone (NASONEX) 50 MCG/ACT nasal spray Place 2 sprays into the nose daily. 1 each 11   Multiple Vitamin (MULTIVITAMIN ADULT PO) Take by mouth daily.     NON FORMULARY prosta -strong prostate supplement     Olopatadine HCl 0.2 % SOLN Place 1 drop into both eyes daily. 2.5 mL 6   pramipexole (MIRAPEX) 0.125 MG tablet Take 1 tablet (0.125 mg total) by mouth daily as needed (RLS symptoms). 30 tablet 6   No facility-administered medications prior to visit.     Per HPI unless specifically indicated in ROS section below Review of Systems  Objective:  BP 136/82   Pulse 80   Temp 97.8 F (36.6 C) (Temporal)   Ht 6\' 1"  (1.854 m)   Wt (!) 306 lb 4  oz (138.9 kg)   SpO2 97%   BMI 40.40 kg/m   Wt Readings from Last 3 Encounters:  12/30/20 (!) 306 lb 4 oz (138.9 kg)  12/25/20 (!) 312 lb 6 oz (141.7 kg)  11/14/20 (!) 310 lb 9.6 oz (140.9 kg)      Physical Exam Vitals and nursing note reviewed.  Constitutional:      Appearance: Normal appearance. He is not ill-appearing.  Eyes:     Extraocular Movements: Extraocular movements intact.     Pupils: Pupils are equal, round, and reactive to light.  Cardiovascular:     Rate and Rhythm: Normal rate and regular rhythm.     Pulses: Normal pulses.     Heart sounds: Normal heart sounds. No murmur heard. Pulmonary:     Effort: Pulmonary effort is normal. No respiratory distress.     Breath sounds: Normal breath sounds. No wheezing, rhonchi or rales.  Chest:     Chest wall: Tenderness present. No mass or swelling.        Comments: Reproducible tenderness to palpation at left medial mid ribcage  Musculoskeletal:     Cervical back: Normal range of motion and neck supple.     Right lower leg: No edema.     Left lower leg: No edema.  Lymphadenopathy:     Cervical: No cervical adenopathy.     Upper Body:     Right upper body: No supraclavicular adenopathy.     Left upper body: No supraclavicular adenopathy.  Skin:    General: Skin is warm and dry.     Findings: No rash.  Neurological:     Mental Status: He is alert.  Psychiatric:        Mood and Affect: Mood normal.        Behavior: Behavior normal.      Results for orders placed or performed in visit on 12/30/20  Basic metabolic panel  Result Value Ref Range   Sodium 136 135 - 145 mEq/L   Potassium 4.6 3.5 - 5.1 mEq/L   Chloride 101 96 - 112 mEq/L   CO2 26 19 - 32 mEq/L   Glucose, Bld 93 70 - 99 mg/dL   BUN 18 6 - 23 mg/dL   Creatinine, Ser 8.32 0.40 - 1.50 mg/dL   GFR 54.98 >26.41 mL/min   Calcium 9.8 8.4 - 10.5 mg/dL  CBC with Differential/Platelet  Result Value Ref Range   WBC 8.1 4.0 - 10.5 K/uL   RBC 5.35 4.22 - 5.81 Mil/uL   Hemoglobin 15.4 13.0 - 17.0 g/dL   HCT 58.3 09.4 - 07.6 %   MCV 85.3 78.0 - 100.0 fl   MCHC 33.8 30.0 - 36.0 g/dL   RDW 80.8 81.1 - 03.1 %   Platelets 277.0 150.0 - 400.0 K/uL   Neutrophils Relative % 62.2 43.0 - 77.0 %   Lymphocytes Relative 26.3 12.0 - 46.0 %   Monocytes Relative 7.5 3.0 - 12.0 %   Eosinophils Relative 3.3 0.0 - 5.0 %   Basophils Relative 0.7 0.0 - 3.0 %   Neutro Abs 5.1 1.4 - 7.7 K/uL   Lymphs Abs 2.1 0.7 - 4.0 K/uL   Monocytes Absolute 0.6 0.1 - 1.0 K/uL   Eosinophils Absolute 0.3 0.0 - 0.7 K/uL   Basophils Absolute 0.1 0.0 - 0.1 K/uL  TSH  Result Value Ref Range   TSH 1.35 0.35 - 5.50 uIU/mL  D-dimer, quantitative  Result Value Ref Range   D-Dimer, Quant 0.20 <0.50 mcg/mL  FEU  Troponin I (High Sensitivity)  Result Value Ref Range   High Sens Troponin I 3 2 - 17 ng/L    DG Chest 2 View CLINICAL DATA:  Left-sided chest pain.  EXAM: CHEST - 2 VIEW  COMPARISON:  None.  FINDINGS: The heart size and mediastinal contours are within normal limits. Both lungs are clear. The visualized skeletal structures are unremarkable.  IMPRESSION: No active cardiopulmonary disease.  Electronically Signed   By: Marin Roberts M.D.   On: 12/30/2020 15:12  EKG - NSR rate 70s, normal axis, intervals, no hypertrophy or acute ST/T changes  Assessment & Plan:  This visit occurred during the SARS-CoV-2 public health emergency.  Safety protocols were in place, including screening questions prior to the visit, additional usage of staff PPE, and extensive cleaning of exam room while observing appropriate contact time as indicated for disinfecting solutions.   Problem List Items Addressed This Visit     Left-sided chest pain - Primary    Describes 2 separate chest pains - 1. Substernal to left sided pressure tightness worse with exertion, improved with rest - concerning for cardiac cause - check labs, CXR, EKG and refer to cardiology for further evaluation. In interim he will start 162mg  aspirin daily. 2. Sharp left sided chest wall pain that is reproducible and has pleuritic component - anticipate MSK ?ribcage strain. Treat with tylenol and heating pad. Check CXR, D dimer.       Relevant Orders   DG Chest 2 View (Completed)   EKG 12-Lead (Completed)   Basic metabolic panel (Completed)   CBC with Differential/Platelet (Completed)   TSH (Completed)   D-dimer, quantitative (Completed)   Troponin I   Troponin I (High Sensitivity) (Completed)   Ambulatory referral to Cardiology   Left cervical radiculopathy    These symptoms have significantly improved with home exercise program recommended by Dr .        No orders of the defined types were placed in this encounter.  Orders Placed This Encounter  Procedures   DG Chest 2 View    Standing Status:    Future    Number of Occurrences:   1    Standing Expiration Date:   12/30/2021    Order Specific Question:   Reason for Exam (SYMPTOM  OR DIAGNOSIS REQUIRED)    Answer:   left chest pain    Order Specific Question:   Preferred imaging location?    Answer:   01/01/2022   Basic metabolic panel   CBC with Differential/Platelet   TSH   D-dimer, quantitative   Troponin I   Ambulatory referral to Cardiology    Referral Priority:   Routine    Referral Type:   Consultation    Referral Reason:   Specialty Services Required    Requested Specialty:   Cardiology    Number of Visits Requested:   1   EKG 12-Lead     Patient Instructions  Chest xray today  EKG today  Labs today  Reproducible chest wall pain sounds like a rib strain  We will refer you to cardiology for further evaluation.  In the meantime, start taking tylenol 500mg  three times daily with meals, use heating pad to left chest wall.   Follow up plan: Return if symptoms worsen or fail to improve.  Gar Gibbon, MD

## 2020-12-30 NOTE — Assessment & Plan Note (Signed)
These symptoms have significantly improved with home exercise program recommended by Dr Patsy Lager.

## 2020-12-31 LAB — D-DIMER, QUANTITATIVE: D-Dimer, Quant: 0.2 mcg/mL FEU (ref <0.50)

## 2020-12-31 NOTE — Addendum Note (Signed)
Addended by: [name] on: 12/31/2020 07:52 AM   Modules accepted: Orders

## 2021-01-28 ENCOUNTER — Ambulatory Visit (INDEPENDENT_AMBULATORY_CARE_PROVIDER_SITE_OTHER): Payer: 59

## 2021-01-28 ENCOUNTER — Other Ambulatory Visit: Payer: Self-pay

## 2021-01-28 ENCOUNTER — Encounter: Payer: Self-pay | Admitting: Cardiology

## 2021-01-28 ENCOUNTER — Ambulatory Visit: Payer: 59 | Admitting: Cardiology

## 2021-01-28 VITALS — BP 120/82 | HR 84 | Ht 75.0 in | Wt 310.0 lb

## 2021-01-28 DIAGNOSIS — R072 Precordial pain: Secondary | ICD-10-CM | POA: Diagnosis not present

## 2021-01-28 DIAGNOSIS — R0609 Other forms of dyspnea: Secondary | ICD-10-CM | POA: Diagnosis not present

## 2021-01-28 DIAGNOSIS — I499 Cardiac arrhythmia, unspecified: Secondary | ICD-10-CM

## 2021-01-28 DIAGNOSIS — Z6838 Body mass index (BMI) 38.0-38.9, adult: Secondary | ICD-10-CM

## 2021-01-28 DIAGNOSIS — E7800 Pure hypercholesterolemia, unspecified: Secondary | ICD-10-CM | POA: Diagnosis not present

## 2021-01-28 MED ORDER — METOPROLOL TARTRATE 100 MG PO TABS
100.0000 mg | ORAL_TABLET | Freq: Once | ORAL | 0 refills | Status: DC
Start: 2021-01-28 — End: 2021-03-03

## 2021-01-28 MED ORDER — IVABRADINE HCL 7.5 MG PO TABS: 15.0000 mg | ORAL_TABLET | Freq: Once | ORAL | 0 refills | Status: AC

## 2021-01-28 NOTE — Progress Notes (Signed)
Cardiology Office Note:    Date:  01/28/2021   ID:  Danny Cox, DOB 1970-06-13, MRN [id number]  PCP:  [name], MD   [facility] Providers Cardiologist:  Debbe Odea, MD     Referring MD: [name], MD   Chief Complaint  Patient presents with   New Patient (Initial Visit)    Referred by PCP for Chest pain. Patient states the chest pain has resolved. Patient c.o sob at times.  Meds reviewed verbally with patient.    Danny Cox is a 50 y.o. male who is being seen today for the evaluation of chest pain at the request of [name], MD.   History of Present Illness:    Danny Cox is a 50 y.o. male with a hx of hyperlipidemia, obesity, OSA on CPAP who presents due to chest pain and shortness of breath.  Patient with shortness of breath on exertion over the past 6 months.  Symptoms have been worsening.  Shortness of breath usually occurs with activity such as taking out trash.  Had episodes of chest pain a month ago lasting 10 days.  Chest pain was reproducible with palpation.  Overall symptoms of chest pain currently resolved.  Also noticed occasional irregular heartbeats, not associated with dizziness.  Sometimes associated with exertion.  He is compliant with CPAP mask for OSA treatment.  Denies any immediate family history of heart attacks.  Past Medical History:  Diagnosis Date   Elevated BP    resolved with OSA treatment   Headache(784.0)    sinus   Heartburn    tums   Horseshoe kidney 2014   found incidentally on CT abd for kidney stone   Irregular heart rhythm    Nephrolithiasis 2014   ca oxalate   Obesity    OSA on CPAP 2011   Perennial allergic rhinitis    predominantly congestion   Tinnitus    right ear   Tobacco dipper quit 2015    Past Surgical History:  Procedure Laterality Date   COLONOSCOPY WITH PROPOFOL N/A 05/08/2020   WNL, rpt 10 yrs Norma Fredrickson, Boykin Nearing, MD)   TONSILLECTOMY AND ADENOIDECTOMY   02/16/1982    Current Medications: Current Meds  Medication Sig   Azelastine HCl (ASTEPRO) 0.15 % SOLN Place 1 spray into the nose in the morning and at bedtime.   ivabradine (CORLANOR) 7.5 MG TABS tablet Take 2 tablets (15 mg total) by mouth once for 1 dose. Take 2 hours prior to CT   metoprolol tartrate (LOPRESSOR) 100 MG tablet Take 1 tablet (100 mg total) by mouth once for 1 dose. Take 2 hours prior to CT   mometasone (NASONEX) 50 MCG/ACT nasal spray Place 2 sprays into the nose daily.   Multiple Vitamin (MULTIVITAMIN ADULT PO) Take by mouth daily.   NON FORMULARY prosta -strong prostate supplement   Olopatadine HCl 0.2 % SOLN Place 1 drop into both eyes daily.   pramipexole (MIRAPEX) 0.125 MG tablet Take 1 tablet (0.125 mg total) by mouth daily as needed (RLS symptoms).     Allergies:   Flonase [fluticasone propionate] and Sulfa antibiotics   Social History   Socioeconomic History   Marital status: Married    Spouse name: Not on file   Number of children: Not on file   Years of education: Not on file   Highest education level: Not on file  Occupational History   Not on file  Tobacco Use   Smoking status: Never   Smokeless  tobacco: Former    Types: Snuff   Tobacco comments:    1 can/day  Vaping Use   Vaping Use: Never used  Substance and Sexual Activity   Alcohol use: Yes    Comment: occasional   Drug use: No   Sexual activity: Not on file  Other Topics Concern   Not on file  Social History Narrative   Caffeine: 2 cups coffee/day, 1 red bull per week     Lives with wife, 1 cat and 1 dog     Occupation: Midwife for [employer]    Edu: college    Activity: goes to gym 4-5 times weekly    Diet: good water, fruits/vegetables daily    Social Determinants of Corporate investment banker Strain: Not on file  Food Insecurity: Not on file  Transportation Needs: Not on file  Physical Activity: Not on file  Stress: Not on file  Social Connections: Not on file      Family History: The patient's family history includes CAD (age of onset: 25) in his maternal uncle; Diabetes in his paternal grandfather; Prostatitis in his father; Stroke in his paternal grandfather; Stroke (age of onset: 43) in his mother. There is no history of Cancer or Hypertension.  ROS:   Please see the history of present illness.     All other systems reviewed and are negative.  EKGs/Labs/Other Studies Reviewed:    The following studies were reviewed today:   EKG:  EKG is  ordered today.  The ekg ordered today demonstrates normal sinus rhythm, normal ECG.  Recent Labs: 08/15/2020: ALT 15 12/30/2020: BUN 18; Creatinine, Ser 0.92; Hemoglobin 15.4; Platelets 277.0; Potassium 4.6; Sodium 136; TSH 1.35  Recent Lipid Panel    Component Value Date/Time   CHOL 228 (H) 08/15/2020 0744   TRIG 104.0 08/15/2020 0744   HDL 43.40 08/15/2020 0744   CHOLHDL 5 08/15/2020 0744   VLDL 20.8 08/15/2020 0744   LDLCALC 164 (H) 08/15/2020 0744   LDLDIRECT 127.0 07/06/2016 1533     Risk Assessment/Calculations:          Physical Exam:    VS:  BP 120/82 (BP Location: Left Arm, Patient Position: Sitting, Cuff Size: Large)   Pulse 84   Ht  (1.905 m)   Wt (!) 310 lb (140.6 kg)   SpO2 97%   BMI 38.75 kg/m     Wt Readings from Last 3 Encounters:  01/28/21 (!) 310 lb (140.6 kg)  12/30/20 (!) 306 lb 4 oz (138.9 kg)  12/25/20 (!) 312 lb 6 oz (141.7 kg)     GEN:  Well nourished, well developed in no acute distress HEENT: Normal NECK: No JVD; No carotid bruits LYMPHATICS: No lymphadenopathy CARDIAC: RRR, no murmurs, rubs, gallops RESPIRATORY:  Clear to auscultation without rales, wheezing or rhonchi  ABDOMEN: Soft, non-tender, non-distended MUSCULOSKELETAL:  No edema; No deformity  SKIN: Warm and dry NEUROLOGIC:  Alert and oriented x 3 PSYCHIATRIC:  Normal affect   ASSESSMENT:    1. Precordial pain   2. Dyspnea on exertion   3. Pure hypercholesterolemia   4. BMI  38.0-38.9,adult   5. Irregular heart beat    PLAN:    In order of problems listed above:  Chest pain, appears musculoskeletal, currently resolved.  Continue to monitor. Dyspnea on exertion, this could be an anginal equivalent.  Get echocardiogram, get coronary CTA to evaluate presence of CAD.  If cardiac work-up normal, obesity/deconditioning likely may contributor. Hyperlipidemia, 10-year ASCVD risk  3.6%.  Low-cholesterol diet advised.  Patient not in statin benefit. Obesity, low-calorie diet, weight loss advised. Irregular heartbeat, place cardiac monitor to evaluate any arrhythmia such as A. fib or flutter in light of OSA.  Follow-up after echo, cardiac monitor, coronary CTA.     Medication Adjustments/Labs and Tests Ordered: Current medicines are reviewed at length with the patient today.  Concerns regarding medicines are outlined above.  Orders Placed This Encounter  Procedures   CT CORONARY MORPH W/CTA COR W/SCORE W/CA W/CM &/OR WO/CM   Basic Metabolic Panel (BMET)   LONG TERM MONITOR (3-14 DAYS)   EKG 12-Lead   ECHOCARDIOGRAM COMPLETE    Meds ordered this encounter  Medications   metoprolol tartrate (LOPRESSOR) 100 MG tablet    Sig: Take 1 tablet (100 mg total) by mouth once for 1 dose. Take 2 hours prior to CT    Dispense:  1 tablet    Refill:  0   ivabradine (CORLANOR) 7.5 MG TABS tablet    Sig: Take 2 tablets (15 mg total) by mouth once for 1 dose. Take 2 hours prior to CT    Dispense:  2 tablet    Refill:  0    Do not run insurance, patient will bring GoodRx and pay cash     Patient Instructions  Medication Instructions:  Your physician recommends that you continue on your current medications as directed. Please refer to the Current Medication list given to you today.   *If you need a refill on your cardiac medications before your next appointment, please call your pharmacy*   Lab Work:  You will have labs (BMP) drawn today in the office prior to  leaving.  If you have labs (blood work) drawn today and your tests are completely normal, you will receive your results only by: MyChart Message (if you have MyChart) OR A paper copy in the mail If you have any lab test that is abnormal or we need to change your treatment, we will call you to review the results.   Testing/Procedures:  Echocardiogram must be scheduled out at least 2 and a half weeks - Your physician has requested that you have an echocardiogram. Echocardiography is a painless test that uses sound waves to create images of your heart. It provides your doctor with information about the size and shape of your heart and how well your heart's chambers and valves are working. This procedure takes approximately one hour. There are no restrictions for this procedure.   2. Your cardiac CT will be scheduled at:  [facility] [address] [place], [place] [address] [phone]  Please arrive 15 mins early for check-in and test prep.    Please follow these instructions carefully (unless otherwise directed):  Hold all erectile dysfunction medications at least 3 days (72 hrs) prior to test.  On the Night Before the Test: Be sure to Drink plenty of water. Do not consume any caffeinated/decaffeinated beverages or chocolate 12 hours prior to your test.  On the Day of the Test: Drink plenty of water until 1 hour prior to the test. Do not eat any food 4 hours prior to the test. You may take your regular medications prior to the test.  Take metoprolol (Lopressor) two hours prior to test. Take ivabradine (Corlanor) two hours prior to test.       After the Test: Drink plenty of water. After receiving IV contrast, you may experience a mild flushed feeling. This is  normal. On occasion, you may experience a mild rash up to 24 hours after the test. This is not dangerous. If this occurs, you can take Benadryl 25 mg and increase your fluid  intake. If you experience trouble breathing, this can be serious. If it is severe call 911 IMMEDIATELY. If it is mild, please call our office. If you take any of these medications: Glipizide/Metformin, Avandament, Glucavance, please do not take 48 hours after completing test unless otherwise instructed.  Please allow 2-4 weeks for scheduling of routine cardiac CTs. Some insurance companies require a pre-authorization which may delay scheduling of this test.   For non-scheduling related questions, please contact the cardiac imaging nurse navigator should you have any questions/concerns: Rockwell Alexandria, Cardiac Imaging Nurse Navigator Larey Brick, Cardiac Imaging Nurse Navigator [facility] Direct Office Dial: [phone]   For scheduling needs, including cancellations and rescheduling, please call [place], [phone].    3. Your physician has recommended that you wear a Zio monitor for 2 weeks. This monitor is a medical device that records the heart's electrical activity. Doctors most often use these monitors to diagnose arrhythmias. Arrhythmias are problems with the speed or rhythm of the heartbeat. The monitor is a small device applied to your chest. You can wear one while you do your normal daily activities. While wearing this monitor if you have any symptoms to push the button and record what you felt. Once you have worn this monitor for the period of time provider prescribed (Usually 14 days), you will return the monitor device in the postage paid box. Once it is returned they will download the data collected and provide [place] with a report which the provider will then review and we will call you with those results. Important tips:  Avoid showering during the first 24 hours of wearing the monitor. Avoid excessive sweating to help maximize wear time. Do not submerge the device, no hot tubs, and no swimming pools. Keep any lotions or oils away from the patch. After  24 hours you may shower with the patch on. Take brief showers with your back facing the shower head.  Do not remove patch once it has been placed because that will interrupt data and decrease adhesive wear time. Push the button when you have any symptoms and write down what you were feeling. Once you have completed wearing your monitor, remove and place into box which has postage paid and place in your outgoing mailbox.  If for some reason you have misplaced your box then call our office and we can provide another box and/or mail it off for you.        Follow-Up: At [facility], you and your health needs are our priority.  As part of our continuing mission to provide you with exceptional heart care, we have created designated Provider Care Teams.  These Care Teams include your primary Cardiologist (physician) and Advanced Practice Providers (APPs -  Physician Assistants and Nurse Practitioners) who all work together to provide you with the care you need, when you need it.  We recommend signing up for the patient portal called "MyChart".  Sign up information is provided on this After Visit Summary.  MyChart is used to connect with patients for Virtual Visits (Telemedicine).  Patients are able to view lab/test results, encounter notes, upcoming appointments, etc.  Non-urgent messages can be sent to your provider as well.   To learn more about what you can do with MyChart, go to [email or web address].  Your next appointment:   6 week(s)  The format for your next appointment:   In Person  Provider:   You may see Debbe Odea, MD or one of the following Advanced Practice Providers on your designated Care Team:   Nicolasa Ducking, NP Eula Listen, PA-C Cadence Fransico Michael, PA-C1}    Other Instructions N/A   Signed, Debbe Odea, MD  01/28/2021 9:26 AM    [facility]

## 2021-01-28 NOTE — Patient Instructions (Signed)
Medication Instructions:  Your physician recommends that you continue on your current medications as directed. Please refer to the Current Medication list given to you today.   *If you need a refill on your cardiac medications before your next appointment, please call your pharmacy*   Lab Work:  You will have labs (BMP) drawn today in the office prior to leaving.  If you have labs (blood work) drawn today and your tests are completely normal, you will receive your results only by: MyChart Message (if you have MyChart) OR A paper copy in the mail If you have any lab test that is abnormal or we need to change your treatment, we will call you to review the results.   Testing/Procedures:  Echocardiogram must be scheduled out at least 2 and a half weeks - Your physician has requested that you have an echocardiogram. Echocardiography is a painless test that uses sound waves to create images of your heart. It provides your doctor with information about the size and shape of your heart and how well your heart's chambers and valves are working. This procedure takes approximately one hour. There are no restrictions for this procedure.   2. Your cardiac CT will be scheduled at:  [facility] [address] [place], [place] [address] [phone]  Please arrive 15 mins early for check-in and test prep.    Please follow these instructions carefully (unless otherwise directed):  Hold all erectile dysfunction medications at least 3 days (72 hrs) prior to test.  On the Night Before the Test: Be sure to Drink plenty of water. Do not consume any caffeinated/decaffeinated beverages or chocolate 12 hours prior to your test.  On the Day of the Test: Drink plenty of water until 1 hour prior to the test. Do not eat any food 4 hours prior to the test. You may take your regular medications prior to the test.  Take metoprolol (Lopressor) two hours prior to  test. Take ivabradine (Corlanor) two hours prior to test.       After the Test: Drink plenty of water. After receiving IV contrast, you may experience a mild flushed feeling. This is normal. On occasion, you may experience a mild rash up to 24 hours after the test. This is not dangerous. If this occurs, you can take Benadryl 25 mg and increase your fluid intake. If you experience trouble breathing, this can be serious. If it is severe call 911 IMMEDIATELY. If it is mild, please call our office. If you take any of these medications: Glipizide/Metformin, Avandament, Glucavance, please do not take 48 hours after completing test unless otherwise instructed.  Please allow 2-4 weeks for scheduling of routine cardiac CTs. Some insurance companies require a pre-authorization which may delay scheduling of this test.   For non-scheduling related questions, please contact the cardiac imaging nurse navigator should you have any questions/concerns: Rockwell Alexandria, Cardiac Imaging Nurse Navigator Larey Brick, Cardiac Imaging Nurse Navigator [facility] Direct Office Dial: [phone]   For scheduling needs, including cancellations and rescheduling, please call [place], [phone].    3. Your physician has recommended that you wear a Zio monitor for 2 weeks. This monitor is a medical device that records the heart's electrical activity. Doctors most often use these monitors to diagnose arrhythmias. Arrhythmias are problems with the speed or rhythm of the heartbeat. The monitor is a small device applied to your chest. You can wear one while you do your normal daily  activities. While wearing this monitor if you have any symptoms to push the button and record what you felt. Once you have worn this monitor for the period of time provider prescribed (Usually 14 days), you will return the monitor device in the postage paid box. Once it is returned they will download the data collected  and provide [place] with a report which the provider will then review and we will call you with those results. Important tips:  Avoid showering during the first 24 hours of wearing the monitor. Avoid excessive sweating to help maximize wear time. Do not submerge the device, no hot tubs, and no swimming pools. Keep any lotions or oils away from the patch. After 24 hours you may shower with the patch on. Take brief showers with your back facing the shower head.  Do not remove patch once it has been placed because that will interrupt data and decrease adhesive wear time. Push the button when you have any symptoms and write down what you were feeling. Once you have completed wearing your monitor, remove and place into box which has postage paid and place in your outgoing mailbox.  If for some reason you have misplaced your box then call our office and we can provide another box and/or mail it off for you.        Follow-Up: At [facility], you and your health needs are our priority.  As part of our continuing mission to provide you with exceptional heart care, we have created designated Provider Care Teams.  These Care Teams include your primary Cardiologist (physician) and Advanced Practice Providers (APPs -  Physician Assistants and Nurse Practitioners) who all work together to provide you with the care you need, when you need it.  We recommend signing up for the patient portal called "MyChart".  Sign up information is provided on this After Visit Summary.  MyChart is used to connect with patients for Virtual Visits (Telemedicine).  Patients are able to view lab/test results, encounter notes, upcoming appointments, etc.  Non-urgent messages can be sent to your provider as well.   To learn more about what you can do with MyChart, go to [email or web address].    Your next appointment:   6 week(s)  The format for your next appointment:   In Person  Provider:   You may see Debbe Odea,  MD or one of the following Advanced Practice Providers on your designated Care Team:   Nicolasa Ducking, NP Eula Listen, PA-C Cadence Fransico Michael, PA-C1}    Other Instructions N/A

## 2021-01-29 LAB — BASIC METABOLIC PANEL
BUN/Creatinine Ratio: 20 (ref 9–20)
BUN: 18 mg/dL (ref 6–24)
CO2: 21 mmol/L (ref 20–29)
Calcium: 9.7 mg/dL (ref 8.7–10.2)
Chloride: 102 mmol/L (ref 96–106)
Creatinine, Ser: 0.92 mg/dL (ref 0.76–1.27)
Glucose: 88 mg/dL (ref 70–99)
Potassium: 4.2 mmol/L (ref 3.5–5.2)
Sodium: 139 mmol/L (ref 134–144)
eGFR: 103 mL/min/{1.73_m2} (ref >59)

## 2021-02-04 DIAGNOSIS — I499 Cardiac arrhythmia, unspecified: Secondary | ICD-10-CM | POA: Diagnosis not present

## 2021-02-18 ENCOUNTER — Encounter: Payer: Self-pay | Admitting: Cardiology

## 2021-02-24 ENCOUNTER — Other Ambulatory Visit: Payer: Self-pay | Admitting: Cardiology

## 2021-02-24 ENCOUNTER — Other Ambulatory Visit: Payer: Self-pay | Admitting: Pulmonary Disease

## 2021-02-24 DIAGNOSIS — R072 Precordial pain: Secondary | ICD-10-CM

## 2021-02-24 MED ORDER — AZELASTINE HCL 0.15 % NA SOLN: 1.0000 | Freq: Two times a day (BID) | NASAL | Status: AC

## 2021-02-26 ENCOUNTER — Telehealth (HOSPITAL_COMMUNITY): Payer: Self-pay | Admitting: Emergency Medicine

## 2021-02-26 NOTE — Telephone Encounter (Signed)
Reaching out to patient to offer assistance regarding upcoming cardiac imaging study; pt verbalizes understanding of appt date/time, parking situation and where to check in, pre-test NPO status and medications ordered, and verified current allergies; name and call back number provided for further questions should they arise Rockwell Alexandria RN Navigator Cardiac Imaging [facility] [phone] office [phone] cell   Denies iv issues R better than L  Arrival 315pm 100mg  metoprolol + 15mg  ivabradine

## 2021-02-27 ENCOUNTER — Ambulatory Visit
Admission: RE | Admit: 2021-02-27 | Discharge: 2021-02-27 | Disposition: A | Discharge status: Home / Self Care | Payer: 59 | Source: Ambulatory Visit | Attending: Cardiology | Admitting: Cardiology

## 2021-02-27 ENCOUNTER — Other Ambulatory Visit: Payer: Self-pay

## 2021-02-27 DIAGNOSIS — R072 Precordial pain: Secondary | ICD-10-CM

## 2021-02-27 MED ORDER — NITROGLYCERIN 0.4 MG SL SUBL
0.8000 mg | SUBLINGUAL_TABLET | Freq: Once | SUBLINGUAL | Status: AC
  Administered 2021-02-27: 0.8 mg via SUBLINGUAL

## 2021-02-27 MED ORDER — IOHEXOL 350 MG/ML SOLN
100.0000 mL | Freq: Once | INTRAVENOUS | Status: AC | PRN
  Administered 2021-02-27: 100 mL via INTRAVENOUS

## 2021-02-27 NOTE — Progress Notes (Signed)
Patient tolerated procedure well. Ambulate w/o difficulty. Denies light headedness or being dizzy. Sitting in chair drinking water provided. Encouraged to drink extra water today and reasoning explained. Verbalized understanding. All questions answered. ABC intact. No further needs. Discharge from procedure area w/o issues.   °

## 2021-03-03 ENCOUNTER — Telehealth: Payer: Self-pay

## 2021-03-03 MED ORDER — METOPROLOL SUCCINATE ER 25 MG PO TB24: 25.0000 mg | ORAL_TABLET | Freq: Every day | ORAL | 5 refills | Status: DC

## 2021-03-03 NOTE — Telephone Encounter (Signed)
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  Keep follow-up appointment with myself for detailed explanation.  May require exercise stress testing.

## 2021-03-03 NOTE — Telephone Encounter (Signed)
-----   Message from Debbe Odea, MD sent at 03/01/2021  3:36 PM EST ----- Paroxysmal SVT noted, likely cause for patient's symptoms of irregular heartbeat.  Start Toprol XL 25 mg daily to help suppress extra heartbeats.  Keep follow-up appointment.

## 2021-03-03 NOTE — Telephone Encounter (Signed)
Called patient and discussed his Zio and CCTA result note as documented below.  Patient verbalized understanding and agreed with plan.

## 2021-03-04 ENCOUNTER — Other Ambulatory Visit: Payer: 59

## 2021-03-17 ENCOUNTER — Ambulatory Visit: Payer: 59 | Admitting: Cardiology

## 2021-03-25 ENCOUNTER — Other Ambulatory Visit: Payer: Self-pay

## 2021-03-25 ENCOUNTER — Ambulatory Visit (INDEPENDENT_AMBULATORY_CARE_PROVIDER_SITE_OTHER): Payer: 59

## 2021-03-25 DIAGNOSIS — R072 Precordial pain: Secondary | ICD-10-CM | POA: Diagnosis not present

## 2021-03-25 DIAGNOSIS — R0609 Other forms of dyspnea: Secondary | ICD-10-CM | POA: Diagnosis not present

## 2021-03-25 LAB — ECHOCARDIOGRAM COMPLETE
AR max vel: 3.63 cm2
AV Area VTI: 3.96 cm2
AV Area mean vel: 3.69 cm2
AV Mean grad: 4 mmHg
AV Peak grad: 6.9 mmHg
Ao pk vel: 1.31 m/s
Area-P 1/2: 2.55 cm2
S' Lateral: 3.6 cm

## 2021-03-25 MED ORDER — PERFLUTREN LIPID MICROSPHERE
1.0000 mL | INTRAVENOUS | Status: AC | PRN
  Administered 2021-03-25: 2 mL via INTRAVENOUS

## 2021-03-26 ENCOUNTER — Encounter: Payer: Self-pay | Admitting: Nurse Practitioner

## 2021-03-26 ENCOUNTER — Ambulatory Visit: Payer: 59 | Admitting: Nurse Practitioner

## 2021-03-26 VITALS — BP 126/70 | HR 82 | Ht 75.0 in | Wt 308.5 lb

## 2021-03-26 DIAGNOSIS — G4733 Obstructive sleep apnea (adult) (pediatric): Secondary | ICD-10-CM

## 2021-03-26 DIAGNOSIS — E782 Mixed hyperlipidemia: Secondary | ICD-10-CM

## 2021-03-26 DIAGNOSIS — I2510 Atherosclerotic heart disease of native coronary artery without angina pectoris: Secondary | ICD-10-CM

## 2021-03-26 DIAGNOSIS — R0609 Other forms of dyspnea: Secondary | ICD-10-CM

## 2021-03-26 DIAGNOSIS — I471 Supraventricular tachycardia: Secondary | ICD-10-CM

## 2021-03-26 DIAGNOSIS — Q245 Malformation of coronary vessels: Secondary | ICD-10-CM

## 2021-03-26 MED ORDER — DILTIAZEM HCL ER COATED BEADS 120 MG PO CP24: 120.0000 mg | ORAL_CAPSULE | Freq: Every day | ORAL | 1 refills | Status: DC

## 2021-03-26 NOTE — Progress Notes (Signed)
Office Visit    Patient Name: Danny Cox Date of Encounter: 03/26/2021  Primary Care Provider:  Ria Bush, MD Primary Cardiologist:  Kate Sable, MD  Chief Complaint    51 year old male with a history of sleep apnea and obesity, who was recently evaluated secondary to chest pain, dyspnea, and palpitations presents for follow-up to review completed testing.  Past Medical History    Past Medical History:  Diagnosis Date   Anomalous right coronary artery    a. 02/2021 Cor CTA: RCA originates from L cor sinus w/ slit-like orifice and interarterial course.   Elevated BP    resolved with OSA treatment   Headache(784.0)    sinus   Heartburn    tums   History of echocardiogram    a. 03/2021 Echo: EF 60-65%, no rwma, nl RV fxn. Ao root 45mm.   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 02/2021 Cor CTA: LM nl, LAD <25p, LCX nl, RCA dominant - originates from L cor sinus w/ slit-like orifice and interarterial course. Cor Ca2+ = 10.6 (74th %'ile).   Obesity    OSA on CPAP 02/16/2009   Perennial allergic rhinitis    predominantly congestion   PSVT (paroxysmal supraventricular tachycardia) ([place])    a. 02/2021 Zio: Predominantly RSR @ 84 (50-187), 4 SVT runs - longest 9.6 secs, max rate 187. Triggered event assoc w/ SVT. Rare PACs/PVCs-->Toprol XL added.   Tinnitus    right ear   Tobacco dipper quit 2015   Past Surgical History:  Procedure Laterality Date   COLONOSCOPY WITH PROPOFOL N/A 05/08/2020   WNL, rpt 10 yrs Alice Reichert, Benay Pike, MD)   TONSILLECTOMY AND ADENOIDECTOMY  02/16/1982    Allergies  Allergies  Allergen Reactions   Flonase [Fluticasone Propionate] Other (See Comments)    Changed taste   Sulfa Antibiotics Other (See Comments)    Rash and swelling with trouble breathing    History of Present Illness    51 year old  male with the above past medical history including sleep apnea and obesity.  He was recently evaluated in cardiology clinic in December 2022 secondary to a 96-month history of dyspnea on exertion and intermittent palpitations.  He also reported a 10-day episode of chest pain that was reproducible with palpation.  He underwent coronary CT angiogram in January which showed less than 25% proximal LAD stenosis, and an anomalous right coronary artery, originating from the left coronary sinus with a slitlike orifice and interarterial course.  His calcium score was 10.6, placing him in the 74th percentile.  Event monitoring showed predominantly sinus rhythm but he was noted to have 4 runs of SVT, the longest lasting 9.6 seconds, at 187 bpm.  He was contacted and Toprol-XL 25 mg daily was recommended however, he did not start this because he works for the [employer] and Toprol-XL has a listed side effect of drowsiness and would require approval through his employee health prior to initiating.  Finally, he underwent echocardiogram earlier this month which showed an EF of 60 to 65%, normal RV function, and a 38 mm aortic root.  Since his last visit, Mr. Duby has generally felt well.  He continues to have some degree of dyspnea on exertion.  He has not had any recurrence of chest pain.  He experiences a brief run of palpitations about once every 3 days,  usually at rest, lasting a few seconds, resolving spontaneously.  He would be interested in alternate Toprol-XL such as diltiazem.  We had a long discussion today regarding his coronary CT findings and role of exercise stress testing to evaluate for ischemia in the setting of anomalous right coronary artery with interarterial course.  He denies PND, orthopnea, dizziness, syncope, edema, or early satiety.  Home Medications    Current Outpatient Medications  Medication Sig Dispense Refill   Azelastine HCl (ASTEPRO) 0.15 % SOLN Place 1 spray into the nose in the morning  and at bedtime.     diltiazem (CARDIZEM CD) 120 MG 24 hr capsule Take 1 capsule (120 mg total) by mouth daily. 90 capsule 1   mometasone (NASONEX) 50 MCG/ACT nasal spray Place 2 sprays into the nose daily. 1 each 11   Multiple Vitamin (MULTIVITAMIN ADULT PO) Take by mouth daily.     NON FORMULARY prosta -strong prostate supplement     Olopatadine HCl 0.2 % SOLN Place 1 drop into both eyes daily. 2.5 mL 6   pramipexole (MIRAPEX) 0.125 MG tablet Take 1 tablet (0.125 mg total) by mouth daily as needed (RLS symptoms). 30 tablet 6   No current facility-administered medications for this visit.     Review of Systems    Ongoing dyspnea on exertion at higher levels of activity.  Experiences brief episode of palpitations about once every 3 days.  He denies chest pain, PND, orthopnea, dizziness, syncope, edema, or early satiety all other systems reviewed and are otherwise negative except as noted above.    Physical Exam    VS:  BP 126/70 (BP Location: Left Arm, Patient Position: Sitting, Cuff Size: Large)    Pulse 82    Ht 6\' 3"  (1.905 m)    Wt (!) 308 lb 8 oz (139.9 kg)    SpO2 98%    BMI 38.56 kg/m  , BMI Body mass index is 38.56 kg/m.     GEN: Obese, in no acute distress. HEENT: normal. Neck: Supple, no JVD, carotid bruits, or masses. Cardiac: RRR, no murmurs, rubs, or gallops. No clubbing, cyanosis, edema.  Radials/PT 2+ and equal bilaterally.  Respiratory:  Respirations regular and unlabored, clear to auscultation bilaterally. GI: Obese, soft, nontender, nondistended, BS + x 4. MS: no deformity or atrophy. Skin: warm and dry, no rash. Neuro:  Strength and sensation are intact. Psych: Normal affect.  Accessory Clinical Findings     Lab Results  Component Value Date   WBC 8.1 12/30/2020   HGB 15.4 12/30/2020   HCT 45.7 12/30/2020   MCV 85.3 12/30/2020   PLT 277.0 12/30/2020   Lab Results  Component Value Date   CREATININE 0.92 01/28/2021   BUN 18 01/28/2021   NA 139 01/28/2021    K 4.2 01/28/2021   CL 102 01/28/2021   CO2 21 01/28/2021   Lab Results  Component Value Date   ALT 15 08/15/2020   AST 13 08/15/2020   ALKPHOS 55 08/15/2020   BILITOT 0.6 08/15/2020   Lab Results  Component Value Date   CHOL 228 (H) 08/15/2020   HDL 43.40 08/15/2020   LDLCALC 164 (H) 08/15/2020   LDLDIRECT 127.0 07/06/2016   TRIG 104.0 08/15/2020   CHOLHDL 5 08/15/2020     Assessment & Plan    1.  Nonobstructive CAD/anomalous right coronary artery/dyspnea on exertion: Patient evaluated in December in the setting of atypical/musculoskeletal chest pain and dyspnea on exertion and subsequently underwent coronary CT angiogram which showed  less than 25% stenosis in the LAD and an anomalous right coronary artery originating from the left coronary sinus with a slitlike orifice and interarterial course.  Coronary calcium was 10.6, placing him in the 74th percentile.  Since his last visit, he has continued to have dyspnea on exertion with higher levels of activity.  No chest pain.  We discussed his coronary CT angiogram findings in detail today.  Given elevated cardiac risk related to the slitlike orifice and interarterial course of his anomalous right coronary artery, along with ongoing dyspnea on exertion, I will arrange for an exercise Myoview to rule out ischemia.  2.  PSVT: Patient with brief episodes of palpitation about once every 3 days.  Recent monitoring showed 4 episodes of SVT, the longest lasting 9.6 seconds with a rate of 187 bpm.  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  He would be interested in an alternate agent if possible.  We agreed to add diltiazem CD 120 mg daily.  3.  Hyperlipidemia: Lipids from June 2022 reviewed in detail with patient today.  Total cholesterol 228 with an HDL of 43, and an LDL of 164.  His 10-year risk calculates  out to 9%.  His coronary calcium score is 10.6, placing him in the 74th percentile, and he was also noted to have minimal proximal LAD disease on coronary CT.  We discussed the role of statin therapy in light of these findings and at least for now, collectively agreed a trial of aggressive lifestyle modifications including diet and exercise.  4.  Morbid obesity: As above, strongly encourage aggressive lifestyle modifications with a goal of weight loss.  Encouraged 30 minutes of exercise daily.  5.  Obstructive sleep apnea: Uses CPAP.  6.  Disposition: Follow-up stress testing as outlined above.  Follow-up in clinic in 4 to 6 weeks or sooner if necessary.   Murray Hodgkins, NP 03/26/2021, 12:28 PM

## 2021-03-26 NOTE — Patient Instructions (Addendum)
Medication Instructions:  - Your physician has recommended you make the following change in your medication:   1) STOP metoprolol  2) START diltiazem 120 mg: - take 1 capsule by mouth once daily   *If you need a refill on your cardiac medications before your next appointment, please call your pharmacy*   Lab Work: - none ordered  If you have labs (blood work) drawn today and your tests are completely normal, you will receive your results only by: MyChart Message (if you have MyChart) OR A paper copy in the mail If you have any lab test that is abnormal or we need to change your treatment, we will call you to review the results.   Testing/Procedures: 1) Exercise Myoview (Cardiac Nuclear Scan):  - Your physician has requested that you have an exercise stress myoview.    [facility] MYOVIEW  Your caregiver has ordered a Stress Test with nuclear imaging. The purpose of this test is to evaluate the blood supply to your heart muscle. This procedure is referred to as a "Non-Invasive Stress Test." This is because other than having an IV started in your vein, nothing is inserted or "invades" your body. Cardiac stress tests are done to find areas of poor blood flow to the heart by determining the extent of coronary artery disease (CAD). Some patients exercise on a treadmill, which naturally increases the blood flow to your heart, while others who are  unable to walk on a treadmill due to physical limitations have a pharmacologic/chemical stress agent called Lexiscan . This medicine will mimic walking on a treadmill by temporarily increasing your coronary blood flow.   Please note: these test may take anywhere between 2-4 hours to complete  PLEASE REPORT TO [facility] MEDICAL MALL ENTRANCE  PROCEED TO THE 1ST DESK ON THE RIGHT TO CHECK IN (REGISTRATION)   Date of Procedure:_____________________________________  Arrival Time for Procedure:______________________________  Instructions regarding  medication:   __xx__ : You may take all of you regular medications the morning of your test with enough water to get them down safely   PLEASE NOTIFY THE OFFICE AT LEAST 24 HOURS IN ADVANCE IF YOU ARE UNABLE TO KEEP YOUR APPOINTMENT.  [phone] AND  PLEASE NOTIFY NUCLEAR MEDICINE AT [facility] AT LEAST 24 HOURS IN ADVANCE IF YOU ARE UNABLE TO KEEP YOUR APPOINTMENT. [phone]  How to prepare for your Myoview test:  Do not eat or drink after midnight No caffeine for 24 hours prior to test No smoking 24 hours prior to test. Your medication may be taken with water.  If your doctor stopped a medication because of this test, do not take that medication. Ladies, please do not wear dresses.  Skirts or pants are appropriate. Please wear a short sleeve shirt. No perfume, cologne or lotion. Wear comfortable walking shoes. No heels!   Follow-Up: At [facility], you and your health needs are our priority.  As part of our continuing mission to provide you with exceptional heart care, we have created designated Provider Care Teams.  These Care Teams include your primary Cardiologist (physician) and Advanced Practice Providers (APPs -  Physician Assistants and Nurse Practitioners) who all work together to provide you with the care you need, when you need it.  We recommend signing up for the patient portal called "MyChart".  Sign up information is provided on this After Visit Summary.  MyChart is used to connect with patients for Virtual Visits (Telemedicine).  Patients are able to view lab/test results, encounter notes, upcoming appointments,  etc.  Non-urgent messages can be sent to your provider as well.   To learn more about what you can do with MyChart, go to [email or web address].    Your next appointment:   6 week(s)  The format for your next appointment:   In Person  Provider:   Debbe Odea, MD    Other Instructions   Diltiazem Extended-Release Capsules or Tablets What is this  medication? DILTIAZEM (dil TYE a zem) treats high blood pressure and prevents chest pain (angina). It works by relaxing the blood vessels, which helps decrease the amount of work your heart has to do. It belongs to a group of medications called calcium channel blockers. This medicine may be used for other purposes; ask your health care provider or pharmacist if you have questions. COMMON BRAND NAME(S): Cardizem CD, Cardizem LA, Cardizem SR, Cartia XT, Dilacor XR, Dilt-CD, Diltia XT, Diltzac, Matzim LA, Verna Czech, TIADYLT ER, Tiamate, Tiazac What should I tell my care team before I take this medication? They need to know if you have any of these conditions: Heart attack Heart disease Irregular heartbeat or rhythm Low blood pressure An unusual or allergic reaction to diltiazem, other medications, foods, dyes, or preservatives Pregnant or trying to get pregnant Breast-feeding How should I use this medication? Take this medication by mouth. Take it as directed on the prescription label at the same time every day. Do not cut, crush or chew this medication. Swallow the capsules whole. You can take it with or without food. If it upsets your stomach, take it with food. Keep taking it unless your care team tells you to stop. Talk to your care team about the use of this medication in children. Special care may be needed. Overdosage: If you think you have taken too much of this medicine contact a poison control center or emergency room at once. NOTE: This medicine is only for you. Do not share this medicine with others. What if I miss a dose? If you miss a dose, take it as soon as you can. If it is almost time for your next dose, take only that dose. Do not take double or extra doses. What may interact with this medication? Do not take this medication with any of the following: Cisapride [place] Pimozide Ranolazine Red yeast rice This medication may also interact with the  following: Buspirone Carbamazepine Cimetidine Cyclosporine Digoxin Local anesthetics or general anesthetics Lovastatin Medications for anxiety or difficulty sleeping like midazolam and triazolam Medications for high blood pressure or heart problems Quinidine Rifampin, rifabutin, or rifapentine This list may not describe all possible interactions. Give your health care provider a list of all the medicines, herbs, non-prescription drugs, or dietary supplements you use. Also tell them if you smoke, drink alcohol, or use illegal drugs. Some items may interact with your medicine. What should I watch for while using this medication? Visit your care team for regular checks on your progress. Check your blood pressure as directed. Ask your care team what your blood pressure should be. Do not treat yourself for coughs, colds, or pain while you are using this medication without asking your care team for advice. Some medications may increase your blood pressure. This medication may cause serious skin reactions. They can happen weeks to months after starting the medication. Contact your care team right away if you notice fevers or flu-like symptoms with a rash. The rash may be red or purple and then turn into blisters or peeling of the skin. Or, you might  notice a red rash with swelling of the face, lips or lymph nodes in your neck or under your arms. You may get drowsy or dizzy. Do not drive, use machinery, or do anything that needs mental alertness until you know how this medication affects you. Do not stand up or sit up quickly, especially if you are an older patient. This reduces the risk of dizzy or fainting spells. What side effects may I notice from receiving this medication? Side effects that you should report to your care team as soon as possible: Allergic reactions--skin rash, itching, hives, swelling of the face, lips, tongue, or throat Heart failure--shortness of breath, swelling of the ankles,  feet, or hands, sudden weight gain, unusual weakness or fatigue Slow heartbeat--dizziness, feeling faint or lightheaded, trouble breathing, unusual weakness or fatigue Liver injury--right upper belly pain, loss of appetite, nausea, light-colored stool, dark yellow or brown urine, yellowing skin or eyes, unusual weakness or fatigue Low blood pressure--dizziness, feeling faint or lightheaded, blurry vision Redness, blistering, peeling, or loosening of the skin, including inside the mouth Side effects that usually do not require medical attention (report to your care team if they continue or are bothersome): Constipation Facial flushing, redness Headache This list may not describe all possible side effects. Call your doctor for medical advice about side effects. You may report side effects to FDA at 1-800-FDA-1088. Where should I keep my medication? Keep out of the reach of children and pets. Store at room temperature between 20 and 25 degrees C (68 and 77 degrees F). Protect from moisture. Keep the container tightly closed. Throw away any unused medication after the expiration date. NOTE: This sheet is a summary. It may not cover all possible information. If you have questions about this medicine, talk to your doctor, pharmacist, or health care provider.  2022 Elsevier/Gold Standard (2020-10-22 00:00:00)  Cardiac Nuclear Scan A cardiac nuclear scan is a test that is done to check the flow of blood to your heart. It is done when you are resting and when you are exercising. The test looks for problems such as: Not enough blood reaching a portion of the heart. The heart muscle not working as it should. You may need this test if: You have heart disease. You have had lab results that are not normal. You have had heart surgery or a balloon procedure to open up blocked arteries (angioplasty). You have chest pain. You have shortness of breath. In this test, a special dye (tracer) is put into your  bloodstream. The tracer will travel to your heart. A camera will then take pictures of your heart to see how the tracer moves through your heart. This test is usually done at a hospital and takes 2-4 hours. Tell a doctor about: Any allergies you have. All medicines you are taking, including vitamins, herbs, eye drops, creams, and over-the-counter medicines. Any problems you or family members have had with anesthetic medicines. Any blood disorders you have. Any surgeries you have had. Any medical conditions you have. Whether you are pregnant or may be pregnant. What are the risks? Generally, this is a safe test. However, problems may occur, such as: Serious chest pain and heart attack. This is only a risk if the stress portion of the test is done. Rapid heartbeat. A feeling of warmth in your chest. This feeling usually does not last long. Allergic reaction to the tracer. What happens before the test? Ask your doctor about changing or stopping your normal medicines. This is important.  Follow instructions from your doctor about what you cannot eat or drink. Remove your jewelry on the day of the test. What happens during the test? An IV tube will be inserted into one of your veins. Your doctor will give you a small amount of tracer through the IV tube. You will wait for 20-40 minutes while the tracer moves through your bloodstream. Your heart will be monitored with an electrocardiogram (ECG). You will lie down on an exam table. Pictures of your heart will be taken for about 15-20 minutes. You may also have a stress test. For this test, one of these things may be done: You will be asked to exercise on a treadmill or a stationary bike. You will be given medicines that will make your heart work harder. This is done if you are unable to exercise. When blood flow to your heart has peaked, a tracer will again be given through the IV tube. After 20-40 minutes, you will get back on the exam table.  More pictures will be taken of your heart. Depending on the tracer that is used, more pictures may need to be taken 3-4 hours later. Your IV tube will be removed when the test is over. The test may vary among doctors and hospitals. What happens after the test? Ask your doctor: Whether you can return to your normal schedule, including diet, activities, and medicines. Whether you should drink more fluids. This will help to remove the tracer from your body. Drink enough fluid to keep your pee (urine) pale yellow. Ask your doctor, or the department that is doing the test: When will my results be ready? How will I get my results? Summary A cardiac nuclear scan is a test that is done to check the flow of blood to your heart. Tell your doctor whether you are pregnant or may be pregnant. Before the test, ask your doctor about changing or stopping your normal medicines. This is important. Ask your doctor whether you can return to your normal activities. You may be asked to drink more fluids. This information is not intended to replace advice given to you by your health care provider. Make sure you discuss any questions you have with your health care provider. Document Revised: 10/16/2020 Document Reviewed: 07/17/2020 Elsevier Patient Education  2022 [employer].

## 2021-03-28 ENCOUNTER — Other Ambulatory Visit: Payer: 59

## 2021-04-04 ENCOUNTER — Ambulatory Visit: Payer: 59

## 2021-05-08 ENCOUNTER — Ambulatory Visit: Payer: 59 | Admitting: Nurse Practitioner

## 2021-05-29 ENCOUNTER — Telehealth: Payer: Self-pay | Admitting: Nurse Practitioner

## 2021-05-29 NOTE — Telephone Encounter (Signed)
-----   Message from Andi Devon sent at 05/28/2021 11:47 AM EDT ----- ?Regarding: myoview ?Please reschedule myoview. He canceled last scheduled test. ?Patient has F/U appt with Brion Aliment on 06/05/21.  ?Thank you! ? ?

## 2021-05-29 NOTE — Telephone Encounter (Signed)
Pt returning call from nurse. Please advise 

## 2021-05-29 NOTE — Telephone Encounter (Signed)
Attempted to schedule.  No ans no vm  

## 2021-05-29 NOTE — Telephone Encounter (Signed)
Please keep his appointment for provider to reassess his symptoms. I will make him aware of him cancelling due to his job.  ?

## 2021-05-29 NOTE — Telephone Encounter (Signed)
Patient cancelled because he is unable to comply with 2 day testing due to job .  ? ?Patient stated he was actively trying to loose weight per cancellation note.  ? ? ?Please advise on upcoming visit  ? ?

## 2021-06-05 ENCOUNTER — Ambulatory Visit: Payer: 59 | Admitting: Nurse Practitioner

## 2021-06-05 NOTE — Progress Notes (Deleted)
Office Visit    Patient Name: Danny Cox Date of Encounter: 06/05/2021  Primary Care Provider:  [name], MD Primary Cardiologist:  Debbe Odea, MD  Chief Complaint    51 year old male with a history of sleep apnea and obesity, who was recently evaluated secondary to chest pain, dyspnea, and palpitations presents for follow-up of CAD and to review completed Myoview.  Past Medical History    Past Medical History:  Diagnosis Date   Anomalous right coronary artery    a. 02/2021 Cor CTA: RCA originates from L cor sinus w/ slit-like orifice and interarterial course.   Elevated BP    resolved with OSA treatment   Headache(784.0)    sinus   Heartburn    tums   History of echocardiogram    a. 03/2021 Echo: EF 60-65%, no rwma, nl RV fxn. Ao root 87mm.   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 02/2021 Cor CTA: LM nl, LAD <25p, LCX nl, RCA dominant - originates from L cor sinus w/ slit-like orifice and interarterial course. Cor Ca2+ = 10.6 (74th %'ile).   Obesity    OSA on CPAP 02/16/2009   Perennial allergic rhinitis    predominantly congestion   PSVT (paroxysmal supraventricular tachycardia) (HCC)    a. 02/2021 Zio: Predominantly RSR @ 84 (50-187), 4 SVT runs - longest 9.6 secs, max rate 187. Triggered event assoc w/ SVT. Rare PACs/PVCs-->Toprol XL added.   Tinnitus    right ear   Tobacco dipper quit 2015   Past Surgical History:  Procedure Laterality Date   COLONOSCOPY WITH PROPOFOL N/A 05/08/2020   WNL, rpt 10 yrs Norma Fredrickson, Boykin Nearing, MD)   TONSILLECTOMY AND ADENOIDECTOMY  02/16/1982    Allergies  Allergies  Allergen Reactions   Flonase [Fluticasone Propionate] Other (See Comments)    Changed taste   Sulfa Antibiotics Other (See Comments)    Rash and swelling with trouble breathing    History of Present Illness     51 year old male with the above past medical history including sleep apnea and obesity.  He was recently evaluated in cardiology clinic in December 2022 secondary to a 62-month history of dyspnea on exertion and intermittent palpitations.  He also reported a 10-day episode of chest pain that was reproducible with palpation.  He underwent coronary CT angiogram in January which showed less than 25% proximal LAD stenosis, and an anomalous right coronary artery, originating from the left coronary sinus with a slitlike orifice and interarterial course.  His calcium score was 10.6, placing him in the 74th percentile.  Event monitoring showed predominantly sinus rhythm but he was noted to have 4 runs of SVT, the longest lasting 9.6 seconds, at 187 bpm.  He was contacted and Toprol-XL 25 mg daily was recommended however, he did not start this because he works for the [employer] and Toprol-XL has a listed side effect of drowsiness and would require approval through his employee health prior to initiating.  Finally, he underwent echocardiogram in February which showed an EF of 60 to 65%, normal RV function, and a 38 mm aortic root.  Patient was last seen in cardiology on February 8th. He was feeling generally well and reported no chest pain, though continued to have some dyspnea on exertion. He did report brief runs of palpitations about once every 3 days, usually at rest,  lasting a few seconds, resolving spontaneously. 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 He was referred for exercise stress testing to evaluate for ischemia in the setting of anomalous right coronary artery with interarterial course.   Today, patient is *** diltiazem 120mg  per day. Reports palpitations *** every 3 days. Continues to have *** dyspnea on exertion with higher levels of activity. *** chest pain, PND, orthopnea, dizziness, syncope, edema,  early satiety. *** diet and exercise. *** weight loss. *** CPAP.   Home Medications    Current Outpatient Medications  Medication Sig Dispense Refill   Azelastine HCl (ASTEPRO) 0.15 % SOLN Place 1 spray into the nose in the morning and at bedtime.     diltiazem (CARDIZEM CD) 120 MG 24 hr capsule Take 1 capsule (120 mg total) by mouth daily. 90 capsule 1   mometasone (NASONEX) 50 MCG/ACT nasal spray Place 2 sprays into the nose daily. 1 each 11   Multiple Vitamin (MULTIVITAMIN ADULT PO) Take by mouth daily.     NON FORMULARY prosta -strong prostate supplement     Olopatadine HCl 0.2 % SOLN Place 1 drop into both eyes daily. 2.5 mL 6   pramipexole (MIRAPEX) 0.125 MG tablet Take 1 tablet (0.125 mg total) by mouth daily as needed (RLS symptoms). 30 tablet 6   No current facility-administered medications for this visit.     Review of Systems    ***.  All other systems reviewed and are otherwise negative except as noted above.    Physical Exam    VS:  There were no vitals taken for this visit. , BMI There is no height or weight on file to calculate BMI.     GEN: Well nourished, well developed, in no acute distress. HEENT: normal. Neck: Supple, no JVD, carotid bruits, or masses. Cardiac: RRR, no murmurs, rubs, or gallops. No clubbing, cyanosis, edema.  Radials/DP/PT 2+ and equal bilaterally.  Respiratory:  Respirations regular and unlabored, clear to auscultation bilaterally. GI: Soft, nontender, nondistended, BS + x 4. MS: no deformity or atrophy. Skin: warm and dry, no rash. Neuro:  Strength and sensation are intact. Psych: Normal affect.  Accessory Clinical Findings    ECG personally reviewed by me today - *** - no acute changes.  Lab Results  Component Value Date   WBC 8.1 12/30/2020   HGB 15.4 12/30/2020   HCT 45.7 12/30/2020   MCV 85.3 12/30/2020   PLT 277.0 12/30/2020   Lab Results  Component Value Date   CREATININE 0.92 01/28/2021   BUN 18 01/28/2021   NA 139  01/28/2021   K 4.2 01/28/2021   CL 102 01/28/2021   CO2 21 01/28/2021   Lab Results  Component Value Date   ALT 15 08/15/2020   AST 13 08/15/2020   ALKPHOS 55 08/15/2020   BILITOT 0.6 08/15/2020   Lab Results  Component Value Date   CHOL 228 (H) 08/15/2020   HDL 43.40 08/15/2020   LDLCALC 164 (H) 08/15/2020   LDLDIRECT 127.0 07/06/2016   TRIG 104.0 08/15/2020   CHOLHDL 5 08/15/2020    No results found for: HGBA1C  Assessment & Plan    1.  ***   08/17/2020, NP 06/05/2021, 7:42 AM

## 2021-06-06 ENCOUNTER — Encounter: Payer: Self-pay | Admitting: Nurse Practitioner

## 2021-09-14 ENCOUNTER — Other Ambulatory Visit: Payer: Self-pay | Admitting: Family Medicine

## 2021-09-14 DIAGNOSIS — E785 Hyperlipidemia, unspecified: Secondary | ICD-10-CM

## 2021-09-15 ENCOUNTER — Other Ambulatory Visit (INDEPENDENT_AMBULATORY_CARE_PROVIDER_SITE_OTHER): Payer: 59

## 2021-09-15 DIAGNOSIS — E785 Hyperlipidemia, unspecified: Secondary | ICD-10-CM | POA: Diagnosis not present

## 2021-09-15 LAB — COMPREHENSIVE METABOLIC PANEL
ALT: 18 U/L (ref 0–53)
AST: 17 U/L (ref 0–37)
Albumin: 4.6 g/dL (ref 3.5–5.2)
Alkaline Phosphatase: 46 U/L (ref 39–117)
BUN: 18 mg/dL (ref 6–23)
CO2: 25 mEq/L (ref 19–32)
Calcium: 9.4 mg/dL (ref 8.4–10.5)
Chloride: 104 mEq/L (ref 96–112)
Creatinine, Ser: 1.06 mg/dL (ref 0.40–1.50)
GFR: 82.58 mL/min (ref >60.00)
Glucose, Bld: 99 mg/dL (ref 70–99)
Potassium: 4.3 mEq/L (ref 3.5–5.1)
Sodium: 138 mEq/L (ref 135–145)
Total Bilirubin: 0.6 mg/dL (ref 0.2–1.2)
Total Protein: 7.9 g/dL (ref 6.0–8.3)

## 2021-09-15 LAB — LIPID PANEL
Cholesterol: 220 mg/dL — ABNORMAL HIGH (ref 0–200)
HDL: 44.7 mg/dL (ref >39.00)
LDL Cholesterol: 158 mg/dL — ABNORMAL HIGH (ref 0–99)
NonHDL: 175.73
Total CHOL/HDL Ratio: 5
Triglycerides: 87 mg/dL (ref 0.0–149.0)
VLDL: 17.4 mg/dL (ref 0.0–40.0)

## 2021-09-22 ENCOUNTER — Encounter: Payer: Self-pay | Admitting: Family Medicine

## 2021-09-22 ENCOUNTER — Other Ambulatory Visit: Payer: Self-pay | Admitting: Family Medicine

## 2021-09-22 ENCOUNTER — Ambulatory Visit (INDEPENDENT_AMBULATORY_CARE_PROVIDER_SITE_OTHER): Payer: 59 | Admitting: Family Medicine

## 2021-09-22 VITALS — BP 136/70 | HR 103 | Temp 97.6°F | Ht 72.05 in | Wt 308.5 lb

## 2021-09-22 DIAGNOSIS — Z0000 Encounter for general adult medical examination without abnormal findings: Secondary | ICD-10-CM

## 2021-09-22 DIAGNOSIS — G4733 Obstructive sleep apnea (adult) (pediatric): Secondary | ICD-10-CM | POA: Diagnosis not present

## 2021-09-22 DIAGNOSIS — J3089 Other allergic rhinitis: Secondary | ICD-10-CM

## 2021-09-22 DIAGNOSIS — E785 Hyperlipidemia, unspecified: Secondary | ICD-10-CM

## 2021-09-22 DIAGNOSIS — R079 Chest pain, unspecified: Secondary | ICD-10-CM

## 2021-09-22 DIAGNOSIS — G2581 Restless legs syndrome: Secondary | ICD-10-CM

## 2021-09-22 DIAGNOSIS — Z9989 Dependence on other enabling machines and devices: Secondary | ICD-10-CM

## 2021-09-22 DIAGNOSIS — Q631 Lobulated, fused and horseshoe kidney: Secondary | ICD-10-CM

## 2021-09-22 MED ORDER — MOMETASONE FUROATE 50 MCG/ACT NA SUSP: 2.0000 | Freq: Every day | NASAL | 3 refills | Status: DC

## 2021-09-22 MED ORDER — PRAMIPEXOLE DIHYDROCHLORIDE 0.125 MG PO TABS: 0.1250 mg | ORAL_TABLET | Freq: Every day | ORAL | 6 refills | Status: DC | PRN

## 2021-09-22 MED ORDER — OLOPATADINE HCL 0.2 % OP SOLN: 1.0000 [drp] | Freq: Every day | OPHTHALMIC | 6 refills | Status: DC

## 2021-09-22 NOTE — Assessment & Plan Note (Signed)
Chronic, off medication. Reviewed healthy diet choices to keep LDL under control  The 10-year ASCVD risk score (Arnett DK, et al., 2019) is: 4.5%   Values used to calculate the score:     Age: 50 years     Sex: Male     Is Non-Hispanic African American: No     Diabetic: No     Tobacco smoker: No     Systolic Blood Pressure: 136 mmHg     Is BP treated: No     HDL Cholesterol: 44.7 mg/dL     Total Cholesterol: 220 mg/dL

## 2021-09-22 NOTE — Assessment & Plan Note (Addendum)
Encouraged healthy diet and lifestyle choices to affect sustainable weight loss.  ?

## 2021-09-22 NOTE — Patient Instructions (Addendum)
Touch base with cardiology about repeat stress test You are doing well today  Work on fiber in the diet to improve cholesterol levels.  Good to see you today Return as needed or in 1 year for next physical.   Health Maintenance, Male Adopting a healthy lifestyle and getting preventive care are important in promoting health and wellness. Ask your health care provider about: The right schedule for you to have regular tests and exams. Things you can do on your own to prevent diseases and keep yourself healthy. What should I know about diet, weight, and exercise? Eat a healthy diet  Eat a diet that includes plenty of vegetables, fruits, low-fat dairy products, and lean protein. Do not eat a lot of foods that are high in solid fats, added sugars, or sodium. Maintain a healthy weight Body mass index (BMI) is a measurement that can be used to identify possible weight problems. It estimates body fat based on height and weight. Your health care provider can help determine your BMI and help you achieve or maintain a healthy weight. Get regular exercise Get regular exercise. This is one of the most important things you can do for your health. Most adults should: Exercise for at least 150 minutes each week. The exercise should increase your heart rate and make you sweat (moderate-intensity exercise). Do strengthening exercises at least twice a week. This is in addition to the moderate-intensity exercise. Spend less time sitting. Even light physical activity can be beneficial. Watch cholesterol and blood lipids Have your blood tested for lipids and cholesterol at 49 years of age, then have this test every 5 years. You may need to have your cholesterol levels checked more often if: Your lipid or cholesterol levels are high. You are older than 49 years of age. You are at high risk for heart disease. What should I know about cancer screening? Many types of cancers can be detected early and may often be  prevented. Depending on your health history and family history, you may need to have cancer screening at various ages. This may include screening for: Colorectal cancer. Prostate cancer. Skin cancer. Lung cancer. What should I know about heart disease, diabetes, and high blood pressure? Blood pressure and heart disease High blood pressure causes heart disease and increases the risk of stroke. This is more likely to develop in people who have high blood pressure readings or are overweight. Talk with your health care provider about your target blood pressure readings. Have your blood pressure checked: Every 3-5 years if you are 27-63 years of age. Every year if you are 50 years old or older. If you are between the ages of 35 and 3 and are a current or former smoker, ask your health care provider if you should have a one-time screening for abdominal aortic aneurysm (AAA). Diabetes Have regular diabetes screenings. This checks your fasting blood sugar level. Have the screening done: Once every three years after age 19 if you are at a normal weight and have a low risk for diabetes. More often and at a younger age if you are overweight or have a high risk for diabetes. What should I know about preventing infection? Hepatitis B If you have a higher risk for hepatitis B, you should be screened for this virus. Talk with your health care provider to find out if you are at risk for hepatitis B infection. Hepatitis C Blood testing is recommended for: Everyone born from [birth year] through 1963. Anyone with known risk factors  for hepatitis C. Sexually transmitted infections (STIs) You should be screened each year for STIs, including gonorrhea and chlamydia, if: You are sexually active and are younger than 49 years of age. You are older than 49 years of age and your health care provider tells you that you are at risk for this type of infection. Your sexual activity has changed since you were last screened,  and you are at increased risk for chlamydia or gonorrhea. Ask your health care provider if you are at risk. Ask your health care provider about whether you are at high risk for HIV. Your health care provider may recommend a prescription medicine to help prevent HIV infection. If you choose to take medicine to prevent HIV, you should first get tested for HIV. You should then be tested every 3 months for as long as you are taking the medicine. Follow these instructions at home: Alcohol use Do not drink alcohol if your health care provider tells you not to drink. If you drink alcohol: Limit how much you have to 0-2 drinks a day. Know how much alcohol is in your drink. In the [place] one drink equals one 12 oz bottle of beer (355 mL), one 5 oz glass of wine (148 mL), or one 1 oz glass of hard liquor (44 mL). Lifestyle Do not use any products that contain nicotine or tobacco. These products include cigarettes, chewing tobacco, and vaping devices, such as e-cigarettes. If you need help quitting, ask your health care provider. Do not use street drugs. Do not share needles. Ask your health care provider for help if you need support or information about quitting drugs. General instructions Schedule regular health, dental, and eye exams. Stay current with your vaccines. Tell your health care provider if: You often feel depressed. You have ever been abused or do not feel safe at home. Summary Adopting a healthy lifestyle and getting preventive care are important in promoting health and wellness. Follow your health care provider's instructions about healthy diet, exercising, and getting tested or screened for diseases. Follow your health care provider's instructions on monitoring your cholesterol and blood pressure. This information is not intended to replace advice given to you by your health care provider. Make sure you discuss any questions you have with your health care provider. Document Revised:  06/24/2020 Document Reviewed: 06/24/2020 Elsevier Patient Education  [place].

## 2021-09-22 NOTE — Assessment & Plan Note (Signed)
Managed with PRM mirapex - continue.

## 2021-09-22 NOTE — Assessment & Plan Note (Signed)
Preventative protocols reviewed and updated unless pt declined. Discussed healthy diet and lifestyle.  

## 2021-09-22 NOTE — Assessment & Plan Note (Signed)
S/p cardiac evaluation - pending stress test. He will call to reschedule this.

## 2021-09-22 NOTE — Progress Notes (Signed)
Patient ID: Danny Cox, male    DOB: 18-Jul-1970, 51 y.o.   MRN: [id number]  This visit was conducted in person.  BP 136/70   Pulse (!) 103   Temp 97.6 F (36.4 C) (Temporal)   Ht 6' 0.05" (1.83 m)   Wt (!) 308 lb 8 oz (139.9 kg)   SpO2 96%   BMI 41.78 kg/m    CC: CPE Subjective:   HPI: Danny Cox is a 51 y.o. male presenting on 09/22/2021 for Annual Exam   Chest pain s/p cardiac evaluation 01/2021 showing nonobstructive LAD CAD with anomalous RCA origin, coronary calcium score 10.6 placing him in 74%ile. Exercise stress test recommended, he was unable to complete due to 2 day procedure. He notes chest pain has largely resolved. He notes improvement with chiropractor.   OSA on auto CPAP to setting of ~9-10. Previously saw Dr [facility], needs new referral for OSA.   RLS on mirapex PRN sparingly. Worse with caffeine.   20 lbs down over the past 3 months, has since dropped the weight - walking regularly and healthier exercise   Preventative: COLONOSCOPY WITH PROPOFOL 05/08/2020 - WNL, rpt 10 yrs Norma Fredrickson, Boykin Nearing, MD) Declines flu shot COVID vaccine - declined Td 2006, Tdap 2016, Td 12/2019 Seat belt use discussed  Sunscreen use discussed. No changing moles on skin.  Sleep - averaging 8:43 min hours/night Non smoker - stopped dipping 11/2018.  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 Interim medical history since our last visit reviewed. Allergies and medications reviewed and updated. Outpatient Medications Prior to Visit  Medication Sig Dispense Refill   Azelastine HCl  (ASTEPRO) 0.15 % SOLN Place 1 spray into the nose in the morning and at bedtime.     Multiple Vitamin (MULTIVITAMIN ADULT PO) Take by mouth daily.     NON FORMULARY prosta -strong prostate supplement     mometasone (NASONEX) 50 MCG/ACT nasal spray Place 2 sprays into the nose daily. 1 each 11   Olopatadine HCl 0.2 % SOLN Place 1 drop into both eyes daily. 2.5 mL 6   pramipexole (MIRAPEX) 0.125 MG tablet Take 1 tablet (0.125 mg total) by mouth daily as needed (RLS symptoms). 30 tablet 6   diltiazem (CARDIZEM CD) 120 MG 24 hr capsule Take 1 capsule (120 mg total) by mouth daily. 90 capsule 1   No facility-administered medications prior to visit.     Per HPI unless specifically indicated in ROS section below Review of Systems  Constitutional:  Negative for activity change, appetite change, chills, fatigue, fever and unexpected weight change.  HENT:  Negative for hearing loss.   Eyes:  Negative for visual disturbance.  Respiratory:  Negative for cough, chest tightness, shortness of breath and wheezing.   Cardiovascular:  Negative for chest pain, palpitations and leg swelling.  Gastrointestinal:  Positive for blood in stool (hemorrhoid related). Negative for abdominal distention, abdominal pain, constipation, diarrhea, nausea and vomiting.  Genitourinary:  Negative for difficulty urinating and hematuria.  Musculoskeletal:  Negative for arthralgias, myalgias  and neck pain.  Skin:  Negative for rash.  Neurological:  Negative for dizziness, seizures, syncope and headaches.  Hematological:  Negative for adenopathy. Does not bruise/bleed easily.  Psychiatric/Behavioral:  Negative for dysphoric mood. The patient is not nervous/anxious.     Objective:  BP 136/70   Pulse (!) 103   Temp 97.6 F (36.4 C) (Temporal)   Ht 6' 0.05" (1.83 m)   Wt (!) 308 lb 8 oz (139.9 kg)   SpO2 96%   BMI 41.78 kg/m   Wt Readings from Last 3 Encounters:  09/22/21 (!) 308 lb 8 oz (139.9 kg)  03/26/21 (!) 308 lb 8  oz (139.9 kg)  01/28/21 (!) 310 lb (140.6 kg)      Physical Exam Vitals and nursing note reviewed.  Constitutional:      General: He is not in acute distress.    Appearance: Normal appearance. He is well-developed. He is obese. He is not ill-appearing.  HENT:     Head: Normocephalic and atraumatic.     Right Ear: Hearing, tympanic membrane, ear canal and external ear normal.     Left Ear: Hearing, tympanic membrane, ear canal and external ear normal.  Eyes:     General: No scleral icterus.    Extraocular Movements: Extraocular movements intact.     Conjunctiva/sclera: Conjunctivae normal.     Pupils: Pupils are equal, round, and reactive to light.  Neck:     Thyroid: No thyroid mass or thyromegaly.  Cardiovascular:     Rate and Rhythm: Normal rate and regular rhythm.     Pulses: Normal pulses.          Radial pulses are 2+ on the right side and 2+ on the left side.     Heart sounds: Normal heart sounds. No murmur heard. Pulmonary:     Effort: Pulmonary effort is normal. No respiratory distress.     Breath sounds: Normal breath sounds. No wheezing, rhonchi or rales.  Abdominal:     General: Bowel sounds are normal. There is no distension.     Palpations: Abdomen is soft. There is no mass.     Tenderness: There is no abdominal tenderness. There is no guarding or rebound.     Hernia: No hernia is present.  Musculoskeletal:        General: Normal range of motion.     Cervical back: Normal range of motion and neck supple.     Right lower leg: No edema.     Left lower leg: No edema.  Lymphadenopathy:     Cervical: No cervical adenopathy.  Skin:    General: Skin is warm and dry.     Findings: No rash.  Neurological:     General: No focal deficit present.     Mental Status: He is alert and oriented to person, place, and time.  Psychiatric:        Mood and Affect: Mood normal.        Behavior: Behavior normal.        Thought Content: Thought content normal.        Judgment:  Judgment normal.       Results for orders placed or performed in visit on 09/15/21  Comprehensive metabolic panel  Result Value Ref Range   Sodium 138 135 - 145 mEq/L   Potassium 4.3 3.5 - 5.1 mEq/L   Chloride 104 96 - 112 mEq/L   CO2 25 19 - 32 mEq/L   Glucose, Bld 99 70 -  99 mg/dL   BUN 18 6 - 23 mg/dL   Creatinine, Ser 3.01 0.40 - 1.50 mg/dL   Total Bilirubin 0.6 0.2 - 1.2 mg/dL   Alkaline Phosphatase 46 39 - 117 U/L   AST 17 0 - 37 U/L   ALT 18 0 - 53 U/L   Total Protein 7.9 6.0 - 8.3 g/dL   Albumin 4.6 3.5 - 5.2 g/dL   GFR 60.10 >93.23 mL/min   Calcium 9.4 8.4 - 10.5 mg/dL  Lipid panel  Result Value Ref Range   Cholesterol 220 (H) 0 - 200 mg/dL   Triglycerides 55.7 0.0 - 149.0 mg/dL   HDL 32.20 >25.42 mg/dL   VLDL 70.6 0.0 - 23.7 mg/dL   LDL Cholesterol 628 (H) 0 - 99 mg/dL   Total CHOL/HDL Ratio 5    NonHDL 175.73     Assessment & Plan:   Problem List Items Addressed This Visit     Healthcare maintenance - Primary (Chronic)    Preventative protocols reviewed and updated unless pt declined. Discussed healthy diet and lifestyle.       Perennial allergic rhinitis    Refilled nasonex and eye drops      OSA on CPAP    Sees [facility] on auto CPAP 9-10, excellent control, benefits from machine.       Severe obesity (BMI >= 40) (HCC)    Encouraged healthy diet and lifestyle choices to affect sustainable weight loss.       Horseshoe kidney   Dyslipidemia    Chronic, off medication. Reviewed healthy diet choices to keep LDL under control  The 10-year ASCVD risk score (Arnett DK, et al., 2019) is: 4.5%   Values used to calculate the score:     Age: 42 years     Sex: Male     Is Non-Hispanic African American: No     Diabetic: No     Tobacco smoker: No     Systolic Blood Pressure: 136 mmHg     Is BP treated: No     HDL Cholesterol: 44.7 mg/dL     Total Cholesterol: 220 mg/dL       RLS (restless legs syndrome)    Managed with PRM mirapex - continue.        Left-sided chest pain    S/p cardiac evaluation - pending stress test. He will call to reschedule this.         Meds ordered this encounter  Medications   mometasone (NASONEX) 50 MCG/ACT nasal spray    Sig: Place 2 sprays into the nose daily.    Dispense:  3 each    Refill:  3   Olopatadine HCl 0.2 % SOLN    Sig: Place 1 drop into both eyes daily.    Dispense:  2.5 mL    Refill:  6   pramipexole (MIRAPEX) 0.125 MG tablet    Sig: Take 1 tablet (0.125 mg total) by mouth daily as needed (RLS symptoms).    Dispense:  30 tablet    Refill:  6   No orders of the defined types were placed in this encounter.    Patient instructions: Touch base with cardiology about repeat stress test You are doing well today  Work on fiber in the diet to improve cholesterol levels.  Good to see you today Return as needed or in 1 year for next physical.  Follow up plan: Return in about 1 year (around 09/23/2022) for annual exam, prior fasting for blood  work.  Ria Bush, MD

## 2021-09-22 NOTE — Assessment & Plan Note (Signed)
[facility] on auto CPAP 9-10, excellent control, benefits from machine.

## 2021-09-22 NOTE — Assessment & Plan Note (Signed)
Refilled nasonex and eye drops

## 2021-09-23 NOTE — Telephone Encounter (Signed)
See note from pharmacy requesting changing Nasonex  Last refill 09/22/21  3/3 refills Last office visit 09/22/21 See allergy list

## 2021-09-24 ENCOUNTER — Other Ambulatory Visit: Payer: Self-pay | Admitting: Family Medicine

## 2021-09-24 MED ORDER — MOMETASONE FUROATE 50 MCG/ACT NA SUSP: 2.0000 | Freq: Every day | NASAL | 3 refills | Status: DC

## 2021-09-24 NOTE — Telephone Encounter (Addendum)
ERx - nasonex. Fluticasone (flonase) affected sense of taste. May need to do PA for nasonex.

## 2021-09-24 NOTE — Addendum Note (Signed)
Addended by: [name] on: 09/24/2021 06:28 PM   Modules accepted: Orders

## 2021-09-25 NOTE — Telephone Encounter (Signed)
Message from pharmacy states Nasonex isn't covered by ins co.  Suggests rx for Flonase.

## 2021-09-26 NOTE — Telephone Encounter (Signed)
See other prescrtion request. Nasonex was requested because flonase caused change in taste (adverse reaction)  Can we try PA for nasonex?

## 2021-09-30 NOTE — Telephone Encounter (Signed)
Will do PA for Nasonex.  (see 09/24/21, Refill note)

## 2021-09-30 NOTE — Telephone Encounter (Signed)
Plz submit PA for Nasonex.

## 2021-10-06 ENCOUNTER — Telehealth: Payer: Self-pay

## 2021-10-06 NOTE — Telephone Encounter (Signed)
Prior auth started for Nasonex 50MCG/ACT suspension. Phoebe Sharps Key: Angelia Mould - PA Case ID: [id number]  Per Cover My Meds, [facility] does not review prior authorization for this plan. Please refer to the phone number on the back of the prescription ID card.  I called [facility] at [phone].  Per Corrie Dandy, this medication does not require a prior auth.

## 2021-10-07 NOTE — Telephone Encounter (Addendum)
Spoke with Loraine Leriche at [employer] that no PA is needed for Nasonex.  States it's a rejection that [facility] does sometimes, not necessarily pt's ins co.  However, he tried to put it through using a discount card but got a message saying pt must try and fail fluticasone first.   According to pt's chart, he has tried and failed with Flonase due to it changing his taste.  Submitted new PA; key:  [id number].  Decision pending. Fyi to [place].

## 2021-10-08 NOTE — Telephone Encounter (Signed)
Spoke with pharmacy making them aware of PA approval.  States they will get it ready for pt.

## 2021-10-08 NOTE — Telephone Encounter (Signed)
Prior auth for Mometasone Furoate 50MCG/ACT suspension has been approved. Phoebe Sharps Key: [id number] - [place] Case ID: [id number] - Rx #: [id number] Approved on August 22 CaseId:[id number];Status:Approved;Review Type:Prior Auth;Coverage Start Date:09/07/2021;Coverage End Date:10/07/2022

## 2021-12-12 ENCOUNTER — Ambulatory Visit (INDEPENDENT_AMBULATORY_CARE_PROVIDER_SITE_OTHER): Payer: 59 | Admitting: Family Medicine

## 2021-12-12 ENCOUNTER — Encounter: Payer: Self-pay | Admitting: Family Medicine

## 2021-12-12 VITALS — BP 124/66 | HR 82 | Temp 97.4°F | Ht 70.5 in | Wt 325.1 lb

## 2021-12-12 DIAGNOSIS — G4733 Obstructive sleep apnea (adult) (pediatric): Secondary | ICD-10-CM | POA: Diagnosis not present

## 2021-12-12 DIAGNOSIS — H6501 Acute serous otitis media, right ear: Secondary | ICD-10-CM | POA: Insufficient documentation

## 2021-12-12 MED ORDER — AMOXICILLIN-POT CLAVULANATE 875-125 MG PO TABS: 1.0000 | ORAL_TABLET | Freq: Two times a day (BID) | ORAL | 0 refills | Status: AC

## 2021-12-12 NOTE — Progress Notes (Addendum)
Patient ID: Danny Cox, male    DOB: 1971/01/08, 51 y.o.   MRN: [id number]  This visit was conducted in person.  BP 124/66   Pulse 82   Temp (!) 97.4 F (36.3 C) (Temporal)   Ht 5' 10.5" (1.791 m)   Wt (!) 325 lb 2 oz (147.5 kg)   SpO2 95%   BMI 45.99 kg/m    CC: ear pain  Subjective:   HPI: Danny Cox is a 51 y.o. male presenting on 12/12/2021 for Ear Pain (C/o B ear pain, worse in R ear.  Sxs started after sinus infection last week.  Also, c/o feeling of water in R ear. )   10d h/o sinus infection with initial R ear pain over the weekend. Then this week developed muffled hearing and evident hearing loss. Sinus symptoms have since resolved, intermittent R>L earache. Ongoing sinus pressure. Mild chest congestion and cough.   No fevers/chills, tooth pain ST.  No sick contacts at home.  Wife recently diagnosed with strep throat.   17lb weight gain in the past 2.5 months. Has steel toe shoes on. He's been doing more weight training at the gym.   OSA on auto CPAP to setting of ~9-10. Previously saw Dr Markus Daft ENT, needs new referral for OSA. They stopped doing sleep apnea. Requests referral to [facility] in [place].      Relevant past medical, surgical, family and social history reviewed and updated as indicated. Interim medical history since our last visit reviewed. Allergies and medications reviewed and updated. Outpatient Medications Prior to Visit  Medication Sig Dispense Refill   Azelastine HCl (ASTEPRO) 0.15 % SOLN Place 1 spray into the nose in the morning and at bedtime.     mometasone (NASONEX) 50 MCG/ACT nasal spray Place 2 sprays into the nose daily. 3 each 3   Multiple Vitamin (MULTIVITAMIN ADULT PO) Take by mouth daily.     NON FORMULARY prosta -strong prostate supplement     Olopatadine HCl 0.2 % SOLN Place 1 drop into both eyes daily. 2.5 mL 6   pramipexole (MIRAPEX) 0.125 MG tablet Take 1 tablet (0.125 mg total) by mouth daily as needed (RLS  symptoms). 30 tablet 6   No facility-administered medications prior to visit.     Per HPI unless specifically indicated in ROS section below Review of Systems  Objective:  BP 124/66   Pulse 82   Temp (!) 97.4 F (36.3 C) (Temporal)   Ht 5' 10.5" (1.791 m)   Wt (!) 325 lb 2 oz (147.5 kg)   SpO2 95%   BMI 45.99 kg/m   Wt Readings from Last 3 Encounters:  12/12/21 (!) 325 lb 2 oz (147.5 kg)  09/22/21 (!) 308 lb 8 oz (139.9 kg)  03/26/21 (!) 308 lb 8 oz (139.9 kg)      Physical Exam Vitals and nursing note reviewed.  Constitutional:      Appearance: Normal appearance. He is not ill-appearing.  HENT:     Head: Normocephalic and atraumatic.     Right Ear: Ear canal and external ear normal. Decreased hearing noted. Swelling present. There is no impacted cerumen. Tympanic membrane is injected.     Left Ear: Hearing, tympanic membrane, ear canal and external ear normal. There is no impacted cerumen.     Nose: Nose normal. No mucosal edema, congestion or rhinorrhea.     Right Turbinates: Not enlarged or swollen.     Left Turbinates: Not enlarged or swollen.  Right Sinus: No maxillary sinus tenderness or frontal sinus tenderness.     Left Sinus: No maxillary sinus tenderness or frontal sinus tenderness.     Mouth/Throat:     Mouth: Mucous membranes are moist.     Pharynx: Oropharynx is clear. No oropharyngeal exudate or posterior oropharyngeal erythema.  Eyes:     Extraocular Movements: Extraocular movements intact.     Conjunctiva/sclera: Conjunctivae normal.     Pupils: Pupils are equal, round, and reactive to light.  Cardiovascular:     Rate and Rhythm: Normal rate and regular rhythm.     Pulses: Normal pulses.     Heart sounds: Normal heart sounds. No murmur heard. Pulmonary:     Effort: Pulmonary effort is normal. No respiratory distress.     Breath sounds: Normal breath sounds. No wheezing, rhonchi or rales.  Musculoskeletal:     Cervical back: Normal range of motion  and neck supple. No rigidity.     Right lower leg: No edema.     Left lower leg: No edema.  Lymphadenopathy:     Cervical: No cervical adenopathy.  Skin:    General: Skin is warm and dry.     Findings: No rash.  Neurological:     Mental Status: He is alert.  Psychiatric:        Mood and Affect: Mood normal.        Behavior: Behavior normal.         Assessment & Plan:   Problem List Items Addressed This Visit     OSA on CPAP    Previously followed by Dr Lane Hacker at [facility] E[facility].  Requests referral locally to establish care.  Refer to [facility] pulmonology in [facility]      Relevant Orders   Ambulatory referral to Pulmonology   Severe obesity (BMI >= 40) (HCC)   Acute serous otitis media without rupture, right - Primary    Acute hearing loss of right ear after recent sinusitis, sinusitis symptoms improved after 10 days.  Anticipate acute serous otitis.  In setting of recent sinusitis, Rx augmentin 7d course. Continue nasal steroid.  Update if ongoing hearing loss after 3-4 wks for ENT evaluation.  Pt agrees with plan.       Relevant Medications   amoxicillin-clavulanate (AUGMENTIN) 875-125 MG tablet     Meds ordered this encounter  Medications   amoxicillin-clavulanate (AUGMENTIN) 875-125 MG tablet    Sig: Take 1 tablet by mouth 2 (two) times daily for 7 days.    Dispense:  14 tablet    Refill:  0   Orders Placed This Encounter  Procedures   Ambulatory referral to Pulmonology    Referral Priority:   Routine    Referral Type:   Consultation    Referral Reason:   Specialty Services Required    Requested Specialty:   Pulmonary Disease    Number of Visits Requested:   1     Patient instructions: You have serous otitis possibly after sinusitis.  Treat with augmentin course, continue nasal steroid.  Let us know if ongoing hearing loss past 3-4 weeks to refer you to ENT.   Follow up plan: Return if symptoms worsen or fail to improve.  [name], MD

## 2021-12-12 NOTE — Assessment & Plan Note (Deleted)
Discussed weight gain noted.  

## 2021-12-12 NOTE — Assessment & Plan Note (Signed)
Acute hearing loss of right ear after recent sinusitis, sinusitis symptoms improved after 10 days.  Anticipate acute serous otitis.  In setting of recent sinusitis, Rx augmentin 7d course. Continue nasal steroid.  Update if ongoing hearing loss after 3-4 wks for ENT evaluation.  Pt agrees with plan.

## 2021-12-12 NOTE — Patient Instructions (Addendum)
You have serous otitis possibly after sinusitis.  Treat with augmentin course, continue nasal steroid.  Let us know if ongoing hearing loss past 3-4 weeks to refer you to ENT.   Otitis Media With Effusion, Adult  Otitis media with effusion (OME) is inflammation and fluid (effusion) in the middle ear without having an ear infection. The middle ear is the space behind the eardrum. The middle ear is connected to the back of the throat by a narrow tube (eustachian tube). Normally the eustachian tube drains fluid out of the middle ear. A swollen eustachian tube can become blocked and cause fluid to collect in the middle ear. OME often goes away without treatment. Sometimes OME can lead to hearing problems and recurrent acute ear infections (acute otitis media). These conditions may require treatment. What are the causes? OME is caused by a blocked eustachian tube. This can result from: Allergies. Upper respiratory infections. Enlarged adenoids. The adenoids are areas of soft tissue located high in the back of the throat, behind the nose and the roof of the mouth. They are part of the body's natural defense system (immune system). Rapid changes in pressure, like when an airplane is descending or during scuba diving. In some cases, the cause of this condition is not known. What are the signs or symptoms? Common symptoms of this condition include: A feeling of fullness in your ear. Decreased hearing in the affected ear. Fluid draining into the ear canal. Pain in the ear. In some cases, there are no symptoms. How is this diagnosed?  A health care provider can diagnose OME based on signs and symptoms of the condition. Your provider will also do a physical exam to check for fluid behind the eardrum. During the exam, your health care provider will use an instrument called an otoscope to look in your ear. Your health care provider may do other tests, such as: A hearing test. A tympanogram. This is a  test that shows how well the eardrum moves in response to air pressure in the ear canal. It provides a graph for your health care provider to review. A pneumatic otoscopy. This is a test to check how your eardrum moves in response to changes in pressure. It is done by squeezing a small amount of air into the ear. How is this treated? Treatment for OME depends on the cause of the condition and the severity of symptoms. The first step is often waiting to see if the fluid drains on its own in a few weeks. Home care treatment may include: Over-the-counter pain relievers. A warm, moist cloth placed over the ear. Severe cases may require a procedure to insert tubes in the ears (tympanostomy tubes) to drain the fluid. Follow these instructions at home: Take over-the-counter and prescription medicines only as told by your health care provider. Keep all follow-up visits. Contact a health care provider if: You have pain that gets worse. Hearing in your affected ear gets worse. You have fluid draining from your ear canal. You have dizziness. You develop a fever. Get help right away if: You develop a severe headache. You completely lose hearing in the affected ear. You have bleeding from your ear canal. You have sudden and severe pain in your ear. These symptoms may represent a serious problem that is an emergency. Do not wait to see if the symptoms will go away. Get medical help right away. Call your local emergency services (911 in the [place] Do not drive yourself to the hospital. Summary  Otitis media with effusion (OME) is inflammation and fluid (effusion) in the middle ear without having an ear infection. A swollen eustachian tube can become blocked and cause fluid to collect in the middle ear. Treatment for OME depends on the cause of the condition and the severity of symptoms. Many times, treatment is not needed because the fluid drains on its own in a few weeks. Sometimes OME can lead to  hearing problems and recurrent acute ear infections (acute otitis media), which may require treatment. This information is not intended to replace advice given to you by your health care provider. Make sure you discuss any questions you have with your health care provider. Document Revised: 05/30/2020 Document Reviewed: 05/30/2020 Elsevier Patient Education  2023 [employer].

## 2021-12-12 NOTE — Addendum Note (Signed)
Addended by: Ria Bush on: 12/12/2021 04:23 PM   Modules accepted: Orders

## 2021-12-12 NOTE — Assessment & Plan Note (Signed)
Previously followed by Dr Markus Daft at [facility] ENT.  Requests referral locally to establish care.  Refer to [facility] pulmonology in [place]

## 2021-12-29 ENCOUNTER — Ambulatory Visit (INDEPENDENT_AMBULATORY_CARE_PROVIDER_SITE_OTHER): Payer: 59 | Admitting: Primary Care

## 2021-12-29 ENCOUNTER — Encounter: Payer: Self-pay | Admitting: Primary Care

## 2021-12-29 VITALS — BP 140/78 | HR 79 | Temp 97.7°F | Ht 73.0 in | Wt 324.8 lb

## 2021-12-29 DIAGNOSIS — J3089 Other allergic rhinitis: Secondary | ICD-10-CM | POA: Diagnosis not present

## 2021-12-29 DIAGNOSIS — G2581 Restless legs syndrome: Secondary | ICD-10-CM

## 2021-12-29 DIAGNOSIS — G4733 Obstructive sleep apnea (adult) (pediatric): Secondary | ICD-10-CM | POA: Diagnosis not present

## 2021-12-29 NOTE — Progress Notes (Signed)
Reviewed and agree with assessment/plan.   Coralyn Helling, MD [facility] Pulmonary/Critical Care 12/29/2021, 12:17 PM Pager:  [phone]

## 2021-12-29 NOTE — Assessment & Plan Note (Signed)
-   Chronic sinus congestion - Continue Astelin/Nasacort nasal sprays as directed

## 2021-12-29 NOTE — Assessment & Plan Note (Signed)
-   Improved; Using Mirapex twice a month with good results

## 2021-12-29 NOTE — Progress Notes (Signed)
@Patient  ID: , male    DOB: [birth year], 51 y.o.   MRN: [id number]  Chief Complaint  Patient presents with   Follow-up    Referring provider: [id number], MD  HPI: 51 year old male, never smoked. PMH significant for allergic rhinitis, OSA on CPAP, obesity, dyslipidemia, restless leg syndrome.  Patient of Dr. 54.   PSG 03/23/13 >> AHI 101.5, SpO2 low 70%   12/29/2021 Patient presents today for annual OSA follow-up. He is doing well today. No complaints. No issues with CPAP mask or pressure settings. He does tell me that his machine is making a noise when he exhales. CPAP machine is 64.49 years old. DME company is 9. RLS is better, takes miraprex twice a month.   He has chronic nasal congestion manageable with nasal sprays. He had ear infection last week which was treated with Augmentin, symptoms have cleared up.   Airview download 11/26/21-12/25/21 Average usage 8 hours 55 mins Pressure 5-15cm h20 (13.9cm h20- 95%) Airleaks 8.6L/min (95%) AHI 1.1    Allergies  Allergen Reactions   Flonase [Fluticasone Propionate] Other (See Comments)    Changed taste   Sulfa Antibiotics Other (See Comments)    Rash and swelling with trouble breathing    Immunization History  Administered Date(s) Administered   Td 02/17/2004, 01/02/2020   Tdap 07/10/2014    Past Medical History:  Diagnosis Date   Anomalous right coronary artery    a. 02/2021 Cor CTA: RCA originates from L cor sinus w/ slit-like orifice and interarterial course.   Elevated BP    resolved with OSA treatment   Headache(784.0)    sinus   Heartburn    tums   History of echocardiogram    a. 03/2021 Echo: EF 60-65%, no rwma, nl RV fxn. Ao root 7mm.   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 02/2021 Cor CTA: LM nl, LAD <25p, LCX nl, RCA dominant - originates from L cor  sinus w/ slit-like orifice and interarterial course. Cor Ca2+ = 10.6 (74th %'ile).   Obesity    OSA on CPAP 02/16/2009   Perennial allergic rhinitis    predominantly congestion   PSVT (paroxysmal supraventricular tachycardia)    a. 02/2021 Zio: Predominantly RSR @ 84 (50-187), 4 SVT runs - longest 9.6 secs, max rate 187. Triggered event assoc w/ SVT. Rare PACs/PVCs-->Toprol XL added.   Tinnitus    right ear   Tobacco dipper quit 2015    Tobacco History: Social History   Tobacco Use  Smoking Status Never  Smokeless Tobacco Former   Types: Snuff  Tobacco Comments   1 can/day   Counseling given: Not Answered Tobacco comments: 1 can/day   Outpatient Medications Prior to Visit  Medication Sig Dispense Refill   Azelastine HCl (ASTEPRO) 0.15 % SOLN Place 1 spray into the nose in the morning and at bedtime.     mometasone (NASONEX) 50 MCG/ACT nasal spray Place 2 sprays into the nose daily. 3 each 3   Multiple Vitamin (MULTIVITAMIN ADULT PO) Take by mouth daily.     NON FORMULARY prosta -strong prostate supplement     Olopatadine HCl 0.2 % SOLN Place 1 drop into both eyes daily. 2.5 mL 6   pramipexole (MIRAPEX) 0.125 MG tablet Take 1 tablet (0.125 mg total) by mouth daily as needed (RLS symptoms). 30 tablet 6  No facility-administered medications prior to visit.   Review of Systems  Review of Systems  Constitutional: Negative.  Negative for fatigue.  HENT:  Positive for congestion.   Respiratory: Negative.  Negative for apnea.     Physical Exam  BP (!) 140/78 (BP Location: Left Arm, Cuff Size: Large)   Pulse 79   Temp 97.7 F (36.5 C) (Oral)   Ht 6\' 1"  (1.854 m)   Wt (!) 324 lb 12.8 oz (147.3 kg)   SpO2 98%   BMI 42.85 kg/m  Physical Exam Constitutional:      Appearance: Normal appearance.  HENT:     Head: Normocephalic and atraumatic.     Mouth/Throat:     Mouth: Mucous membranes are moist.     Pharynx: Oropharynx is clear.     Comments: Mallampati class  III Cardiovascular:     Rate and Rhythm: Normal rate and regular rhythm.  Pulmonary:     Effort: Pulmonary effort is normal.     Breath sounds: Normal breath sounds.  Skin:    General: Skin is warm and dry.  Neurological:     General: No focal deficit present.     Mental Status: He is alert and oriented to person, place, and time. Mental status is at baseline.  Psychiatric:        Mood and Affect: Mood normal.        Behavior: Behavior normal.        Thought Content: Thought content normal.        Judgment: Judgment normal.      Lab Results:  CBC    Component Value Date/Time   WBC 8.1 12/30/2020 1216   RBC 5.35 12/30/2020 1216   HGB 15.4 12/30/2020 1216   HGB 15.5 12/14/2013 1620   HCT 45.7 12/30/2020 1216   HCT 46.4 12/14/2013 1620   PLT 277.0 12/30/2020 1216   PLT 241 12/14/2013 1620   MCV 85.3 12/30/2020 1216   MCV 85 12/14/2013 1620   MCH 28.4 12/14/2013 1620   MCHC 33.8 12/30/2020 1216   RDW 13.5 12/30/2020 1216   RDW 12.8 12/14/2013 1620   LYMPHSABS 2.1 12/30/2020 1216   MONOABS 0.6 12/30/2020 1216   EOSABS 0.3 12/30/2020 1216   BASOSABS 0.1 12/30/2020 1216    BMET    Component Value Date/Time   NA 138 09/15/2021 0733   NA 139 01/28/2021 0921   NA 141 12/14/2013 1620   K 4.3 09/15/2021 0733   K 4.0 12/14/2013 1620   CL 104 09/15/2021 0733   CL 106 12/14/2013 1620   CO2 25 09/15/2021 0733   CO2 26 12/14/2013 1620   GLUCOSE 99 09/15/2021 0733   GLUCOSE 104 (H) 12/14/2013 1620   BUN 18 09/15/2021 0733   BUN 18 01/28/2021 0921   BUN 17 12/14/2013 1620   CREATININE 1.06 09/15/2021 0733   CREATININE 0.91 12/14/2013 1620   CALCIUM 9.4 09/15/2021 0733   CALCIUM 9.2 12/14/2013 1620   GFRNONAA >60 12/14/2013 1620   GFRNONAA >60 04/09/2012 2049   GFRAA >60 12/14/2013 1620   GFRAA >60 04/09/2012 2049    BNP No results found for: "BNP"  ProBNP No results found for: "PROBNP"  Imaging: No results found.   Assessment & Plan:   OSA on CPAP -  Severe OSA, well controlled on auto CPAP. Patient is 100% compliant with CPAP and reports benefit from use. Machine is <49 years old, making a humming noise with pressure ramp. Current pressure 5-15cm h20 (13.9cm  h20); Residual AHI 1.1. No significant airleaks. At times he is using full pressure setting, recommend increasing max pressure. Adjust CPAP pressure 5-17cm h20. Renew CPAP supplies with DME company/ [employer]. FU in 1 year or sooner if needed   Perennial allergic rhinitis - Chronic sinus congestion - Continue Astelin/Nasacort nasal sprays as directed   Restless leg syndrome - Improved; Using Mirapex twice a month with good results      Glenford Bayley, NP 12/29/2021

## 2021-12-29 NOTE — Assessment & Plan Note (Signed)
-   Severe OSA, well controlled on auto CPAP. Patient is 100% compliant with CPAP and reports benefit from use. Machine is <49 years old, making a humming noise with pressure ramp. Current pressure 5-15cm h20 (13.9cm h20); Residual AHI 1.1. No significant airleaks. At times he is using full pressure setting, recommend increasing max pressure. Adjust CPAP pressure 5-17cm h20. Renew CPAP supplies with DME company/ [employer]. FU in 1 year or sooner if needed

## 2021-12-29 NOTE — Patient Instructions (Addendum)
Orders: - Adjust CPAP pressure setting 5-17cm h20 - Renew CPAP supplies with Dme company for 1 year - Needs letter for work stating sleep apnea is well controlled on CPAP and patient is compliant with use  Follow-up: - 1 year with Beth NP   CPAP and BIPAP Information CPAP and BIPAP are methods that use air pressure to keep your airways open and to help you breathe well. CPAP and BIPAP use different amounts of pressure. Your health care provider will tell you whether CPAP or BIPAP would be more helpful for you. CPAP stands for "continuous positive airway pressure." With CPAP, the amount of pressure stays the same while you breathe in (inhale) and out (exhale). BIPAP stands for "bi-level positive airway pressure." With BIPAP, the amount of pressure will be higher when you inhale and lower when you exhale. This allows you to take larger breaths. CPAP or BIPAP may be used in the hospital, or your health care provider may want you to use it at home. You may need to have a sleep study before your health care provider can order a machine for you to use at home. What are the advantages? CPAP or BIPAP can be helpful if you have: Sleep apnea. Chronic obstructive pulmonary disease (COPD). Heart failure. Medical conditions that cause muscle weakness, including muscular dystrophy or amyotrophic lateral sclerosis (ALS). Other problems that cause breathing to be shallow, weak, abnormal, or difficult. CPAP and BIPAP are most commonly used for obstructive sleep apnea (OSA) to keep the airways from collapsing when the muscles relax during sleep. What are the risks? Generally, this is a safe treatment. However, problems may occur, including: Irritated skin or skin sores if the mask does not fit properly. Dry or stuffy nose or nosebleeds. Dry mouth. Feeling gassy or bloated. Sinus or lung infection if the equipment is not cleaned properly. When should CPAP or BIPAP be used? In most cases, the mask only  needs to be worn during sleep. Generally, the mask needs to be worn throughout the night and during any daytime naps. People with certain medical conditions may also need to wear the mask at other times, such as when they are awake. Follow instructions from your health care provider about when to use the machine. What happens during CPAP or BIPAP?  Both CPAP and BIPAP are provided by a small machine with a flexible plastic tube that attaches to a plastic mask that you wear. Air is blown through the mask into your nose or mouth. The amount of pressure that is used to blow the air can be adjusted on the machine. Your health care provider will set the pressure setting and help you find the best mask for you. Tips for using the mask Because the mask needs to be snug, some people feel trapped or closed-in (claustrophobic) when first using the mask. If you feel this way, you may need to get used to the mask. One way to do this is to hold the mask loosely over your nose or mouth and then gradually apply the mask more snugly. You can also gradually increase the amount of time that you use the mask. Masks are available in various types and sizes. If your mask does not fit well, talk with your health care provider about getting a different one. Some common types of masks include: Full face masks, which fit over the mouth and nose. Nasal masks, which fit over the nose. Nasal pillow or prong masks, which fit into the nostrils. If  you are using a mask that fits over your nose and you tend to breathe through your mouth, a chin strap may be applied to help keep your mouth closed. Use a skin barrier to protect your skin as told by your health care provider. Some CPAP and BIPAP machines have alarms that may sound if the mask comes off or develops a leak. If you have trouble with the mask, it is very important that you talk with your health care provider about finding a way to make the mask easier to tolerate. Do not  stop using the mask. There could be a negative impact on your health if you stop using the mask. Tips for using the machine Place your CPAP or BIPAP machine on a secure table or stand near an electrical outlet. Know where the on/off switch is on the machine. Follow instructions from your health care provider about how to set the pressure on your machine and when you should use it. Do not eat or drink while the CPAP or BIPAP machine is on. Food or fluids could get pushed into your lungs by the pressure of the CPAP or BIPAP. For home use, CPAP and BIPAP machines can be rented or purchased through home health care companies. Many different brands of machines are available. Renting a machine before purchasing may help you find out which particular machine works well for you. Your health insurance company may also decide which machine you may get. Keep the CPAP or BIPAP machine and attachments clean. Ask your health care provider for specific instructions. Check the humidifier if you have a dry stuffy nose or nosebleeds. Make sure it is working correctly. Follow these instructions at home: Take over-the-counter and prescription medicines only as told by your health care provider. Ask if you can take sinus medicine if your sinuses are blocked. Do not use any products that contain nicotine or tobacco. These products include cigarettes, chewing tobacco, and vaping devices, such as e-cigarettes. If you need help quitting, ask your health care provider. Keep all follow-up visits. This is important. Contact a health care provider if: You have redness or pressure sores on your head, face, mouth, or nose from the mask or head gear. You have trouble using the CPAP or BIPAP machine. You cannot tolerate wearing the CPAP or BIPAP mask. Someone tells you that you snore even when wearing your CPAP or BIPAP. Get help right away if: You have trouble breathing. You feel confused. Summary CPAP and BIPAP are methods  that use air pressure to keep your airways open and to help you breathe well. If you have trouble with the mask, it is very important that you talk with your health care provider about finding a way to make the mask easier to tolerate. Do not stop using the mask. There could be a negative impact to your health if you stop using the mask. Follow instructions from your health care provider about when to use the machine. This information is not intended to replace advice given to you by your health care provider. Make sure you discuss any questions you have with your health care provider. Document Revised: 09/11/2020 Document Reviewed: 01/12/2020 Elsevier Patient Education  2023 [employer].

## 2022-01-26 IMAGING — CT CT HEART MORP W/ CTA COR W/ SCORE W/ CA W/CM &/OR W/O CM
1 of 13 series · 4 of 20 positions shown, 5 images · non-contrast
Comparison: 12/30/2020 chest radiograph.  Chest CT 05/25/2020.

Addendum:
CLINICAL DATA: Dyspnea on exertion

EXAM:
Cardiac/Coronary  CTA
TECHNIQUE: The patient was scanned on a Siemens Somatom go.Top scanner.

[Series 26: multiphase % cta coronary 0.60 · axial · 0.40mm/px · z∈[-1159,-1084]mm · 4 of 3708 slices shown, 5 images]
[im 742/3708  vessel]
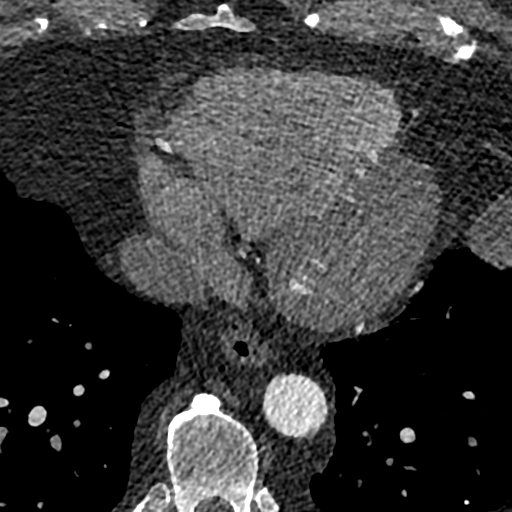
[im 742/3708  lung]
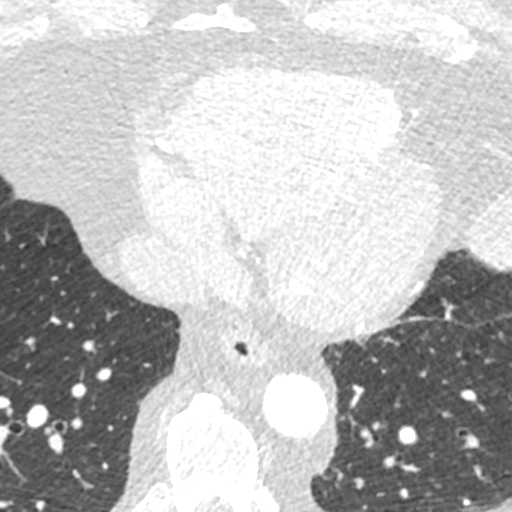
[im 1483/3708  vessel]
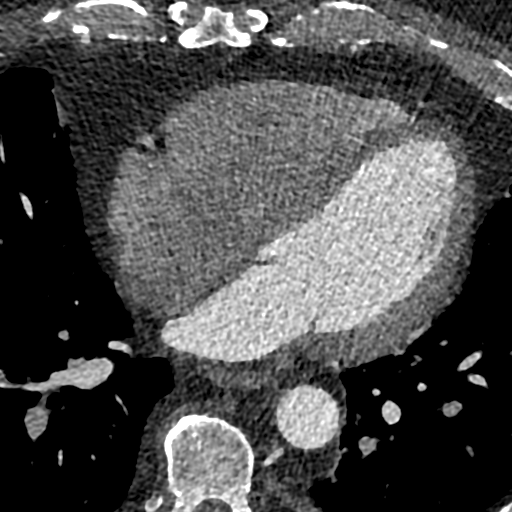
[im 2225/3708  vessel]
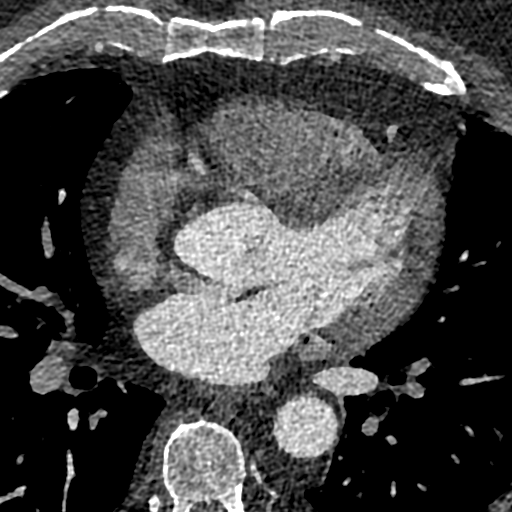
[im 2966/3708  vessel]
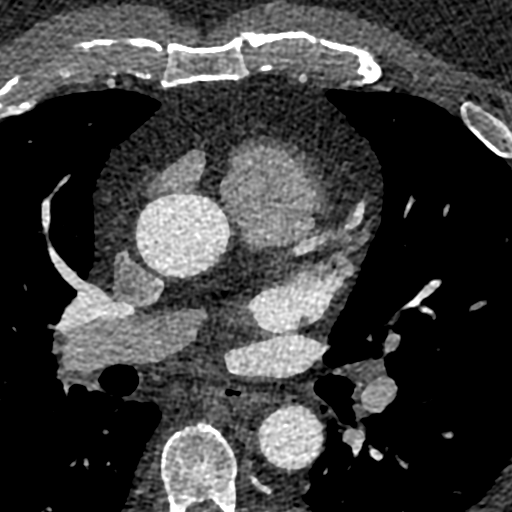

[4 of 20 positions shown; findings below may reference images not displayed]



Aortic Valve:  Trileaflet.  No calcifications.

Coronary Arteries: anomalous coronary origin with RCA originating
from left sinus with inter-arterial course. Right dominance.

RCA is a dominant artery originating from the left coronary sinus.
There is a slit like orifice with no intramural course noted. There
is no plaque.

Left main is a large artery that gives rise to LAD and LCX arteries.
No LM disease.

LAD has minimal proximal calcifications causing minimal stenosis
(<25%).

LCX is a non-dominant artery that gives rise to two obtuse marginal
branches. There is no plaque.

Other findings:

Normal pulmonary vein drainage into the left atrium.

Normal left atrial appendage without a thrombus.

Normal size of the pulmonary artery.
IMPRESSION: 1. Coronary calcium score of 10.6. This was 74th percentile for age
and sex matched control.

2. Anomalous coronary origin with RCA originating from left sinus
with slit like orifice and inter-arterial course.

3. Minimal proximal LAD calcification causing minimal (<25%)
stenosis.

4. CAD-RADS 1. Minimal non-obstructive CAD (0-24%). Consider
preventive therapy and risk factor modification.

5. Image quality degraded by obesity related artifacts.

EXAM:
OVER-READ INTERPRETATION  CT CHEST

The following report is an over-read performed by radiologist Dr.
Arissa Billiot [[email or web address]] on 02/28/2021. This over-read
does not include interpretation of cardiac or coronary anatomy or
pathology. The coronary CTA interpretation by the cardiologist is
attached.
FINDINGS: Vascular: Normal aortic caliber. No central pulmonary embolism, on
this non-dedicated study.

Mediastinum/Nodes: No imaged thoracic adenopathy.

Lungs/Pleura: No pleural fluid.  Clear imaged lungs.

Upper Abdomen: Normal imaged portions of the liver, stomach.

Musculoskeletal: Lower thoracic spondylosis.
IMPRESSION: No acute findings in the imaged extracardiac chest.



Aortic Valve:  Trileaflet.  No calcifications.

Coronary Arteries: anomalous coronary origin with RCA originating
from left sinus with inter-arterial course. Right dominance.

RCA is a dominant artery originating from the left coronary sinus.
There is a slit like orifice with no intramural course noted. There
is no plaque.

Left main is a large artery that gives rise to LAD and LCX arteries.
No LM disease.

LAD has minimal proximal calcifications causing minimal stenosis
(<25%).

LCX is a non-dominant artery that gives rise to two obtuse marginal
branches. There is no plaque.

Other findings:

Normal pulmonary vein drainage into the left atrium.

Normal left atrial appendage without a thrombus.

Normal size of the pulmonary artery.
IMPRESSION: 1. Coronary calcium score of 10.6. This was 74th percentile for age
and sex matched control.

2. Anomalous coronary origin with RCA originating from left sinus
with slit like orifice and inter-arterial course.

3. Minimal proximal LAD calcification causing minimal (<25%)
stenosis.

4. CAD-RADS 1. Minimal non-obstructive CAD (0-24%). Consider
preventive therapy and risk factor modification.

5. Image quality degraded by obesity related artifacts.

## 2022-03-17 ENCOUNTER — Encounter: Payer: Self-pay | Admitting: Family Medicine

## 2022-03-17 DIAGNOSIS — H6501 Acute serous otitis media, right ear: Secondary | ICD-10-CM

## 2022-03-17 DIAGNOSIS — L089 Local infection of the skin and subcutaneous tissue, unspecified: Secondary | ICD-10-CM

## 2022-03-30 ENCOUNTER — Ambulatory Visit: Payer: 59 | Admitting: Family Medicine

## 2022-03-30 ENCOUNTER — Encounter: Payer: Self-pay | Admitting: Family Medicine

## 2022-03-30 VITALS — BP 154/78 | HR 91 | Temp 97.5°F | Ht 73.0 in | Wt 318.4 lb

## 2022-03-30 DIAGNOSIS — U071 COVID-19: Secondary | ICD-10-CM | POA: Diagnosis not present

## 2022-03-30 DIAGNOSIS — H6501 Acute serous otitis media, right ear: Secondary | ICD-10-CM | POA: Diagnosis not present

## 2022-03-30 DIAGNOSIS — R0981 Nasal congestion: Secondary | ICD-10-CM | POA: Diagnosis not present

## 2022-03-30 DIAGNOSIS — L989 Disorder of the skin and subcutaneous tissue, unspecified: Secondary | ICD-10-CM | POA: Insufficient documentation

## 2022-03-30 DIAGNOSIS — R030 Elevated blood-pressure reading, without diagnosis of hypertension: Secondary | ICD-10-CM | POA: Insufficient documentation

## 2022-03-30 DIAGNOSIS — R221 Localized swelling, mass and lump, neck: Secondary | ICD-10-CM

## 2022-03-30 HISTORY — DX: COVID-19: U07.1

## 2022-03-30 LAB — POC COVID19 BINAXNOW: SARS Coronavirus 2 Ag: POSITIVE — AB

## 2022-03-30 NOTE — Patient Instructions (Addendum)
You've tested positive for COVID - reviewed isolation and mask wearing recommendations. Reviewed red flags to seek further care.   Lump in neck - likely small cyst - we will watch for now.  Spot on right temple - likely benign skin spot (seborrheic keratosis) - we can watch as well.   For blood pressure - watch salt/sodium in diet as well as avoid OTC medicines with decongestants that can raise blood pressure. Drink plenty of water, let us know if home BP readings staying >140/90 to discuss medication.

## 2022-03-30 NOTE — Assessment & Plan Note (Addendum)
Small superficial lump to R posterior neck - anticipate small skin cyst as too superficial to be consistent with LN. Will watch for now.

## 2022-03-30 NOTE — Assessment & Plan Note (Signed)
R TM appears inflamed.  Possibly COVID related. Update if worsening symptoms of R ear to consider augmentin course.

## 2022-03-30 NOTE — Assessment & Plan Note (Signed)
BP markedly elevated today, in setting of COVID infection as well as taking decongestants today and eating high sodium meal.  No meds today, I did encourage he go home and drink plenty of water and start monitoring BP more closely at home, let me know if consistently >140/90 to discuss antihypertensive commencement.

## 2022-03-30 NOTE — Assessment & Plan Note (Signed)
Anticipate SK - discussed benign nature of condition. Will watch for now. To let me know if changing for derm eval.

## 2022-03-30 NOTE — Progress Notes (Signed)
Patient ID: Danny Cox, male    DOB: 1970-06-14, 52 y.o.   MRN: [id number]  This visit was conducted in person.  BP (!) 154/78   Pulse 91   Temp (!) 97.5 F (36.4 C) (Temporal)   Ht 6' 1"$  (1.854 m)   Wt (!) 318 lb 6 oz (144.4 kg)   SpO2 96%   BMI 42.00 kg/m   BP Readings from Last 3 Encounters:  03/30/22 (!) 154/78  12/29/21 (!) 140/78  12/12/21 124/66  160/80s on retesting  CC: check scalp Subjective:   HPI: Danny Cox is a 52 y.o. male presenting on 03/30/2022 for Hair/Scalp Problem (C/o scab on L side of scalp. Also, c/o lump on L side of neck, L ear stuffiness and nasal congestion on L side. )   Notes spot to right scalp, noted 10 days ago, seems to have gotten smaller. He's always had darker spot to scalp at temporal region.  Also notes lump to R posterior neck present for 8 months.   Also with 1-2d h/o R ear stuffiness, R nasal congestion and muffled hearing. Dry cough and Pndrainage. First day of symptoms was 03/28/2022 PM. He has not had COVID vaccine.  No fevers/chills, ST, headache, body aches.  Has not tested for COVID.   BP elevated - off medication. He ate street tacos for lunch - high salt meal. Also took dayquil with lunch.  Home BP readings 128-132/74-80 range     Relevant past medical, surgical, family and social history reviewed and updated as indicated. Interim medical history since our last visit reviewed. Allergies and medications reviewed and updated. Outpatient Medications Prior to Visit  Medication Sig Dispense Refill   Azelastine HCl (ASTEPRO) 0.15 % SOLN Place 1 spray into the nose in the morning and at bedtime.     mometasone (NASONEX) 50 MCG/ACT nasal spray Place 2 sprays into the nose daily. 3 each 3   Multiple Vitamin (MULTIVITAMIN ADULT PO) Take by mouth daily.     NON FORMULARY prosta -strong prostate supplement     Olopatadine HCl 0.2 % SOLN Place 1 drop into both eyes daily. 2.5 mL 6   pramipexole (MIRAPEX) 0.125 MG tablet  Take 1 tablet (0.125 mg total) by mouth daily as needed (RLS symptoms). 30 tablet 6   No facility-administered medications prior to visit.     Per HPI unless specifically indicated in ROS section below Review of Systems  Objective:  BP (!) 154/78   Pulse 91   Temp (!) 97.5 F (36.4 C) (Temporal)   Ht 6' 1"$  (1.854 m)   Wt (!) 318 lb 6 oz (144.4 kg)   SpO2 96%   BMI 42.00 kg/m   Wt Readings from Last 3 Encounters:  03/30/22 (!) 318 lb 6 oz (144.4 kg)  12/29/21 (!) 324 lb 12.8 oz (147.3 kg)  12/12/21 (!) 325 lb 2 oz (147.5 kg)      Physical Exam Vitals and nursing note reviewed.  Constitutional:      Appearance: Normal appearance. He is not ill-appearing.  HENT:     Head: Normocephalic and atraumatic.     Right Ear: Hearing, ear canal and external ear normal. There is no impacted cerumen. Tympanic membrane is injected and erythematous (mild).     Left Ear: Hearing, tympanic membrane, ear canal and external ear normal. There is no impacted cerumen.     Ears:     Comments: Mildly erythematous TM on right    Nose: Mucosal  edema, congestion and rhinorrhea present.     Right Turbinates: Not enlarged, swollen or pale.     Left Turbinates: Not enlarged, swollen or pale.     Right Sinus: No maxillary sinus tenderness or frontal sinus tenderness.     Left Sinus: No maxillary sinus tenderness or frontal sinus tenderness.     Mouth/Throat:     Mouth: Mucous membranes are moist.     Pharynx: Oropharynx is clear. No oropharyngeal exudate or posterior oropharyngeal erythema.  Eyes:     Extraocular Movements: Extraocular movements intact.     Conjunctiva/sclera: Conjunctivae normal.     Pupils: Pupils are equal, round, and reactive to light.  Cardiovascular:     Rate and Rhythm: Normal rate and regular rhythm.     Pulses: Normal pulses.     Heart sounds: Normal heart sounds. No murmur heard. Pulmonary:     Effort: Pulmonary effort is normal. No respiratory distress.     Breath  sounds: Normal breath sounds. No wheezing, rhonchi or rales.  Musculoskeletal:     Cervical back: Normal range of motion and neck supple. No rigidity.     Right lower leg: No edema.     Left lower leg: No edema.  Lymphadenopathy:     Cervical: No cervical adenopathy.  Skin:    General: Skin is warm and dry.     Findings: No rash.     Comments:  Rough waxy lesion to R temple  Subcm subcutaneous lump to posterior right neck   Neurological:     Mental Status: He is alert.  Psychiatric:        Mood and Affect: Mood normal.        Behavior: Behavior normal.       Results for orders placed or performed in visit on 03/30/22  POC COVID-19 BinaxNow  Result Value Ref Range   SARS Coronavirus 2 Ag Positive (A) Negative    Assessment & Plan:   Problem List Items Addressed This Visit     Lump in neck    Small superficial lump to R posterior neck - anticipate small skin cyst as too superficial to be consistent with LN. Will watch for now.       Acute serous otitis media without rupture, right    R TM appears inflamed.  Possibly COVID related. Update if worsening symptoms of R ear to consider augmentin course.       COVID-19 virus infection - Primary    Reviewed currently approved antiviral treatments.  Reviewed expected course of illness, anticipated course of recovery, as well as red flags to suggest COVID pneumonia and/or to seek urgent in-person care. Reviewed CDC isolation/quarantine guidelines.  Encouraged fluids and rest. Reviewed further supportive care measures at home including vit C 5108m bid, vit D 2000 IU daily, zinc 1067mdaily, tylenol PRN, pepcid 2078mID PRN.   Recommend:  Full dose paxlovid - pt declined. He will let me know if changes his mind in the next 2-3 days.  Paxlovid drug interactions:  none      Skin lesion of scalp    Anticipate SK - discussed benign nature of condition. Will watch for now. To let me know if changing for derm eval.       Elevated  blood pressure reading in office without diagnosis of hypertension    BP markedly elevated today, in setting of COVID infection as well as taking decongestants today and eating high sodium meal.  No meds today, I did  encourage he go home and drink plenty of water and start monitoring BP more closely at home, let me know if consistently >140/90 to discuss antihypertensive commencement.       Other Visit Diagnoses     Nasal congestion       Relevant Orders   POC COVID-19 BinaxNow (Completed)        No orders of the defined types were placed in this encounter.   Orders Placed This Encounter  Procedures   POC COVID-19 BinaxNow    Order Specific Question:   Previously tested for COVID-19    Answer:   Yes    Order Specific Question:   Resident in a congregate (group) care setting    Answer:   No    Order Specific Question:   Employed in healthcare setting    Answer:   No    Patient Instructions  You've tested positive for COVID - reviewed isolation and mask wearing recommendations. Reviewed red flags to seek further care.   Lump in neck - likely small cyst - we will watch for now.  Spot on right temple - likely benign skin spot (seborrheic keratosis) - we can watch as well.   For blood pressure - watch salt/sodium in diet as well as avoid OTC medicines with decongestants that can raise blood pressure. Drink plenty of water, let us know if home BP readings staying >140/90 to discuss medication.   Follow up plan: Return if symptoms worsen or fail to improve.  Ria Bush, MD

## 2022-03-30 NOTE — Assessment & Plan Note (Addendum)
Reviewed currently approved antiviral treatments.  Reviewed expected course of illness, anticipated course of recovery, as well as red flags to suggest COVID pneumonia and/or to seek urgent in-person care. Reviewed CDC isolation/quarantine guidelines.  Encouraged fluids and rest. Reviewed further supportive care measures at home including vit C 579m bid, vit D 2000 IU daily, zinc 105mdaily, tylenol PRN, pepcid 2037mID PRN.   Recommend:  Full dose paxlovid - pt declined. He will let me know if changes his mind in the next 2-3 days.  Paxlovid drug interactions:  none

## 2022-04-13 ENCOUNTER — Encounter: Payer: Self-pay | Admitting: *Deleted

## 2022-04-14 ENCOUNTER — Ambulatory Visit: Payer: 59 | Admitting: Family Medicine

## 2022-04-14 ENCOUNTER — Encounter: Payer: Self-pay | Admitting: Family Medicine

## 2022-04-14 VITALS — BP 138/82 | HR 80 | Temp 97.6°F | Ht 73.0 in | Wt 316.4 lb

## 2022-04-14 DIAGNOSIS — L089 Local infection of the skin and subcutaneous tissue, unspecified: Secondary | ICD-10-CM

## 2022-04-14 DIAGNOSIS — L729 Follicular cyst of the skin and subcutaneous tissue, unspecified: Secondary | ICD-10-CM | POA: Diagnosis not present

## 2022-04-14 MED ORDER — DOXYCYCLINE HYCLATE 100 MG PO TABS: 100.0000 mg | ORAL_TABLET | Freq: Two times a day (BID) | ORAL | 0 refills | Status: DC

## 2022-04-14 NOTE — Assessment & Plan Note (Signed)
Exam consistent with infected cyst of R neck. No fluctuance currently, doubt I&D would be beneficial at this time. Treat with doxycycline antibiotic course (in sulfa allergy). Photosensitivity precautions reviewed. Rec continue warm compresses, ibuprofen anti inflammatory, update if not improved with this to consider gen surgery referral for excision. Pt agrees with plan.

## 2022-04-14 NOTE — Patient Instructions (Addendum)
You likely have infected cyst to right neck. Take doxycycline antibiotic for 1 week. Continue warm compresses. Let us know if not improving with this, or if area coming to a head as it may need drainage (referral to surgeon).  May take tylenol '500mg'$ , ibuprofen '600mg'$  as needed for pain/swelling - take with food.

## 2022-04-14 NOTE — Progress Notes (Signed)
Patient ID: Danny Cox, male    DOB: 09/01/1970, 52 y.o.   MRN: [id number]  This visit was conducted in person.  BP 138/82   Pulse 80   Temp 97.6 F (36.4 C) (Temporal)   Ht '6\' 1"'$  (1.854 m)   Wt (!) 316 lb 6 oz (143.5 kg)   SpO2 97%   BMI 41.74 kg/m    CC: check skin lump Subjective:   HPI: Danny Cox is a 52 y.o. male presenting on 04/14/2022 for Cyst (C/o possible infected cyst on R side of neck. Area has increased in size, has some redness, is tender to touch and is hot to touch. )   4-5d h/o warm, tender, swollen previously small cyst.  CPAP mask strap runs over area - is very tender.   No fevers.  Hasn't tried anything for this besides hot shower/warm compresses.  Currently with cracked molar. Pending dentist eval.  Fully recovered from [place] earlier this month, didn't take paxlovid.      Relevant past medical, surgical, family and social history reviewed and updated as indicated. Interim medical history since our last visit reviewed. Allergies and medications reviewed and updated. Outpatient Medications Prior to Visit  Medication Sig Dispense Refill   Azelastine HCl (ASTEPRO) 0.15 % SOLN Place 1 spray into the nose in the morning and at bedtime.     mometasone (NASONEX) 50 MCG/ACT nasal spray Place 2 sprays into the nose daily. 3 each 3   Multiple Vitamin (MULTIVITAMIN ADULT PO) Take by mouth daily.     NON FORMULARY prosta -strong prostate supplement     Olopatadine HCl 0.2 % SOLN Place 1 drop into both eyes daily. 2.5 mL 6   pramipexole (MIRAPEX) 0.125 MG tablet Take 1 tablet (0.125 mg total) by mouth daily as needed (RLS symptoms). 30 tablet 6   No facility-administered medications prior to visit.     Per HPI unless specifically indicated in ROS section below Review of Systems  Objective:  BP 138/82   Pulse 80   Temp 97.6 F (36.4 C) (Temporal)   Ht '6\' 1"'$  (1.854 m)   Wt (!) 316 lb 6 oz (143.5 kg)   SpO2 97%   BMI 41.74 kg/m   Wt  Readings from Last 3 Encounters:  04/14/22 (!) 316 lb 6 oz (143.5 kg)  03/30/22 (!) 318 lb 6 oz (144.4 kg)  12/29/21 (!) 324 lb 12.8 oz (147.3 kg)      Physical Exam Vitals and nursing note reviewed.  Constitutional:      Appearance: Normal appearance. He is not ill-appearing.  HENT:     Head: Normocephalic and atraumatic.  Eyes:     Extraocular Movements: Extraocular movements intact.     Pupils: Pupils are equal, round, and reactive to light.  Neck:      Comments: Indurated tender lump 2cm diameter without fluctuance at right neck below ear Musculoskeletal:     Cervical back: Normal range of motion and neck supple. Erythema present.  Lymphadenopathy:     Head:     Right side of head: No submental, submandibular, tonsillar, preauricular, posterior auricular or occipital adenopathy.     Left side of head: No submental, submandibular, tonsillar, preauricular, posterior auricular or occipital adenopathy.     Cervical: No cervical adenopathy.     Right cervical: No superficial or deep cervical adenopathy.    Left cervical: No superficial or deep cervical adenopathy.  Neurological:     Mental Status: He is  alert.         Results for orders placed or performed in visit on 03/30/22  POC COVID-19 [facility]  Result Value Ref Range   SARS Coronavirus 2 Ag Positive (A) Negative    Assessment & Plan:   Problem List Items Addressed This Visit     Infected cyst of skin - Primary    Exam consistent with infected cyst of R neck. No fluctuance currently, doubt I&D would be beneficial at this time. Treat with doxycycline antibiotic course (in sulfa allergy). Photosensitivity precautions reviewed. Rec continue warm compresses, ibuprofen anti inflammatory, update if not improved with this to consider gen surgery referral for excision. Pt agrees with plan.         Meds ordered this encounter  Medications   doxycycline (VIBRA-TABS) 100 MG tablet    Sig: Take 1 tablet (100 mg total) by  mouth 2 (two) times daily.    Dispense:  14 tablet    Refill:  0    No orders of the defined types were placed in this encounter.   Patient Instructions  You likely have infected cyst to right neck. Take doxycycline antibiotic for 1 week. Continue warm compresses. Let us know if not improving with this, or if area coming to a head as it may need drainage (referral to surgeon).  May take tylenol '500mg'$ , ibuprofen '600mg'$  as needed for pain/swelling - take with food.   Follow up plan: Return if symptoms worsen or fail to improve.  Ria Bush, MD

## 2022-04-20 NOTE — Addendum Note (Signed)
Addended by: Ria Bush on: 04/20/2022 03:07 PM   Modules accepted: Orders

## 2022-04-20 NOTE — Telephone Encounter (Signed)
Urgent referral placed to gen surgery for possible infected epidermal cyst of neck

## 2022-04-21 MED ORDER — DOXYCYCLINE HYCLATE 100 MG PO TABS: 100.0000 mg | ORAL_TABLET | Freq: Two times a day (BID) | ORAL | 0 refills | Status: DC

## 2022-04-21 NOTE — Addendum Note (Signed)
Addended by: Ria Bush on: 04/21/2022 05:31 PM   Modules accepted: Orders

## 2022-04-23 ENCOUNTER — Ambulatory Visit: Payer: 59 | Admitting: Surgery

## 2022-04-23 ENCOUNTER — Encounter: Payer: Self-pay | Admitting: Surgery

## 2022-04-23 VITALS — BP 155/88 | HR 89 | Temp 98.4°F | Ht 75.0 in | Wt 313.0 lb

## 2022-04-23 DIAGNOSIS — L089 Local infection of the skin and subcutaneous tissue, unspecified: Secondary | ICD-10-CM | POA: Diagnosis not present

## 2022-04-23 DIAGNOSIS — L723 Sebaceous cyst: Secondary | ICD-10-CM

## 2022-04-23 NOTE — Patient Instructions (Signed)
Today we have drained your Abscess in the office. The numbing medication will wear off in approximately 4-8 hours. You will have some pain to the area afterwards but should not be as severe as prior to the procedure.  You may take 2 extra strength Tylenol or 3 Ibuprofen tablets every 6 hours as needed for discomfort.   If you have been given antibiotics, please continue to take them after your procedure.  You will need to pack the area every day.  You may shower, but remove all of the packing before you shower. Shower letting the warm soapy water run over the area, rinse well, and pat dry. Then you may repack the area, filling all of the cavity space with the packing material, cover with a gauze and tape in place.   We will see you back as scheduled below.   If you have any questions or concerns prior to your appointment, please call our office and speak with a nurse.  Incision and Drainage Incision and drainage is a surgical procedure to open and drain a fluid-filled sac. The sac may be filled with pus, mucus, or blood. Examples of fluid-filled sacs that may need surgical drainage include cysts, skin infections (abscesses), and red lumps that develop from a ruptured cyst or a small abscess (boils). You may need this procedure if the affected area is large, painful, infected, or not healing well. Tell a health care provider about: Any allergies you have. All medicines you are taking, including vitamins, herbs, eye drops, creams, and over-the-counter medicines. Any problems you or family members have had with anesthetic medicines. Any blood disorders you have. Any surgeries you have had. Any medical conditions you have. Whether you are pregnant or may be pregnant. What are the risks? Generally, this is a safe procedure. However, problems may occur, including: Infection. Bleeding. Allergic reactions to medicines. Scarring.  What happens before the procedure? You may need an ultrasound  or other imaging tests to see how large or deep the fluid-filled sac is. You may have blood tests to check for infection. You may get a tetanus shot. You may be given antibiotic medicine to help prevent infection. Follow instructions from your health care provider about eating or drinking restrictions. Ask your health care provider about: Changing or stopping your regular medicines. This is especially important if you are taking diabetes medicines or blood thinners. Taking medicines such as aspirin and ibuprofen. These medicines can thin your blood. Do not take these medicines before your procedure if your health care provider instructs you not to. Plan to have someone take you home after the procedure. If you will be going home right after the procedure, plan to have someone stay with you for 24 hours. What happens during the procedure? To reduce your risk of infection: Your health care team will wash or sanitize their hands. Your skin will be washed with soap. A medicine will be injected to numb the area (local anesthetic). An incision will be made in the top of the fluid-filled sac. The contents of the sac may be squeezed out, or a syringe or tube (catheter) may be used to empty the sac. The inside of the sac may be washed out (irrigated) with a sterile solution and packed with gauze before it is covered with a bandage (dressing). The procedure may vary among health care providers and hospitals.    Incision and Drainage, Care After Refer to this sheet in the next few weeks. These instructions provide you with  information about caring for yourself after your procedure. Your health care provider may also give you more specific instructions. Your treatment has been planned according to current medical practices, but problems sometimes occur. Call your health care provider if you have any problems or questions after your procedure. What can I expect after the procedure? After the procedure,  it is common to have: Pain or discomfort around your incision site. Drainage from your incision.  Follow these instructions at home: Take over-the-counter and prescription medicines only as told by your health care provider. If you were prescribed an antibiotic medicine, take it as told by your health care provider. Do not stop taking the antibiotic even if you start to feel better. Follow instructions from your health care provider about: How to take care of your incision. When and how you should change your packing and bandage (dressing). Wash your hands with soap and water before you change your dressing. If soap and water are not available, use hand sanitizer. When you should remove your dressing. Do not take baths, swim, or use a hot tub until your health care provider approves. Keep all follow-up visits as told by your health care provider. This is important. Check your incision area every day for signs of infection. Check for: More redness, swelling, or pain. More fluid or blood. Warmth. Pus or a bad smell. Contact a health care provider if: Your cyst or abscess returns. You have a fever. You have more redness, swelling, or pain around your incision. You have more fluid or blood coming from your incision. Your incision feels warm to the touch. You have pus or a bad smell coming from your incision. Get help right away if: You have severe pain or bleeding. You cannot eat or drink without vomiting. You have decreased urine output. You become short of breath. You have chest pain. You cough up blood. The area where the incision and drainage occurred becomes numb or it tingles. This information is not intended to replace advice given to you by your health care provider. Make sure you discuss any questions you have with your health care provider. Document Released: 04/27/2011 Document Revised: 07/05/2015 Document Reviewed: 11/23/2014 Elsevier Interactive Patient Education  2017  [employer].

## 2022-04-23 NOTE — Progress Notes (Signed)
Patient ID: Danny Cox, male   DOB: 19-Sep-1970, 52 y.o.   MRN: [id number]  Chief Complaint: Right neck abscess  History of Present Illness Danny Cox is a 52 y.o. male with a prior history of a dermal cyst of his right neck, more recently inflamed/infected with minimal drainage.  Currently on second round of doxycycline.  Tender to touch, denies fevers and chills.  Minimal drainage from skin surface.  Reports it was first noted 9 months ago, irritated by his CPAP mask strapping.  Past Medical History Past Medical History:  Diagnosis Date   Anomalous right coronary artery    a. 02/2021 Cor CTA: RCA originates from L cor sinus w/ slit-like orifice and interarterial course.   Elevated BP    resolved with OSA treatment   Headache(784.0)    sinus   Heartburn    tums   History of echocardiogram    a. 03/2021 Echo: EF 60-65%, no rwma, nl RV fxn. Ao root 6m.   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 02/2021 Cor CTA: LM nl, LAD <25p, LCX nl, RCA dominant - originates from L cor sinus w/ slit-like orifice and interarterial course. Cor Ca2+ = 10.6 (74th %'ile).   Obesity    OSA on CPAP 02/16/2009   Perennial allergic rhinitis    predominantly congestion   PSVT (paroxysmal supraventricular tachycardia)    a. 02/2021 Z[facility]: Predominantly RSR @ 84 (50-187), 4 SVT runs - longest 9.6 secs, max rate 187. Triggered event assoc w/ SVT. Rare PACs/PVCs-->Toprol XL added.   Tinnitus    right ear   Tobacco dipper quit 2015      Past Surgical History:  Procedure Laterality Date   COLONOSCOPY WITH PROPOFOL N/A 05/08/2020   WNL, rpt 10 yrs (Alice Reichert TBenay Pike MD)   TONSILLECTOMY AND ADENOIDECTOMY  02/16/1982    Allergies  Allergen Reactions   Flonase [Fluticasone Propionate] Other (See Comments)    Changed taste   Sulfa Antibiotics Other (See Comments)     Rash and swelling with trouble breathing    Current Outpatient Medications  Medication Sig Dispense Refill   Azelastine HCl (ASTEPRO) 0.15 % SOLN Place 1 spray into the nose in the morning and at bedtime.     doxycycline (VIBRA-TABS) 100 MG tablet Take 1 tablet (100 mg total) by mouth 2 (two) times daily. 14 tablet 0   mometasone (NASONEX) 50 MCG/ACT nasal spray Place 2 sprays into the nose daily. 3 each 3   Multiple Vitamin (MULTIVITAMIN ADULT PO) Take by mouth daily.     NON FORMULARY prosta -strong prostate supplement     Olopatadine HCl 0.2 % SOLN Place 1 drop into both eyes daily. 2.5 mL 6   pramipexole (MIRAPEX) 0.125 MG tablet Take 1 tablet (0.125 mg total) by mouth daily as needed (RLS symptoms). 30 tablet 6   No current facility-administered medications for this visit.    Family History Family History  Problem Relation Age of Onset   Stroke Mother 68  Prostatitis Father    Stroke Paternal Grandfather        mini   Diabetes Paternal Grandfather    CAD Maternal Uncle 545      MI   Cancer Neg Hx    Hypertension Neg Hx       Social History Social History   Tobacco  Use   Smoking status: Never    Passive exposure: Never   Smokeless tobacco: Former    Types: Snuff   Tobacco comments:    1 can/day  Vaping Use   Vaping Use: Never used  Substance Use Topics   Alcohol use: Yes    Comment: occasional   Drug use: No        Review of Systems  Constitutional: Negative.   HENT:  Positive for tinnitus.   Eyes: Negative.   Respiratory: Negative.    Cardiovascular: Negative.   Gastrointestinal: Negative.   Genitourinary: Negative.   Skin:  Negative for itching and rash.  Neurological: Negative.   Psychiatric/Behavioral: Negative.       Physical Exam Blood pressure (!) 155/88, pulse 89, temperature 98.4 F (36.9 C), height '6\' 3"'$  (1.905 m), weight (!) 313 lb (142 kg), SpO2 97 %. Last Weight  Most recent update: 04/23/2022 10:38 AM    Weight  142 kg (313 lb)                CONSTITUTIONAL: Well developed, and nourished, appropriately responsive and aware without distress.   EYES: Sclera non-icteric.   EARS, NOSE, MOUTH AND THROAT:  The oropharynx is clear. Oral mucosa is pink and moist.    Hearing is intact to voice.  NECK: Trachea is midline, and there is no jugular venous distension.  LYMPH NODES:  Lymph nodes in the neck are not appreciated.  There is a 2.5 cm raised red fluctuant area on overlying the mid sternocleidomastoid on the right.  The skin is somewhat compromised, showing some evidence of drainage. RESPIRATORY:   Normal respiratory effort without pathologic use of accessory muscles. CARDIOVASCULAR:  Well perfused.  GI: The abdomen is  soft, nontender, and nondistended. Obese.  MUSCULOSKELETAL:  Symmetrical muscle tone appreciated in all four extremities.    SKIN: Skin turgor is normal. No pathologic skin lesions appreciated.  NEUROLOGIC:  Motor and sensation appear grossly normal.  Cranial nerves are grossly without defect. PSYCH:  Alert and oriented to person, place and time. Affect is appropriate for situation.  Data Reviewed I have personally reviewed what is currently available of the patient's imaging, recent labs and medical records.   Labs:     Latest Ref Rng & Units 12/30/2020   12:16 PM 02/22/2019   12:57 PM 08/05/2018    8:39 AM  CBC  WBC 4.0 - 10.5 K/uL 8.1  7.9  6.1   Hemoglobin 13.0 - 17.0 g/dL 15.4  14.6  15.0   Hematocrit 39.0 - 52.0 % 45.7  44.0  44.7   Platelets 150.0 - 400.0 K/uL 277.0  269.0  254.0       Latest Ref Rng & Units 09/15/2021    7:33 AM 01/28/2021    9:21 AM 12/30/2020   12:16 PM  CMP  Glucose 70 - 99 mg/dL 99  88  93   BUN 6 - 23 mg/dL '18  18  18   '$ Creatinine 0.40 - 1.50 mg/dL 1.06  0.92  0.92   Sodium 135 - 145 mEq/L 138  139  136   Potassium 3.5 - 5.1 mEq/L 4.3  4.2  4.6   Chloride 96 - 112 mEq/L 104  102  101   CO2 19 - 32 mEq/L '25  21  26   '$ Calcium 8.4 - 10.5 mg/dL 9.4  9.7  9.8    Total Protein 6.0 - 8.3 g/dL 7.9     Total Bilirubin 0.2 - 1.2  mg/dL 0.6     Alkaline Phos 39 - 117 U/L 46     AST 0 - 37 U/L 17     ALT 0 - 53 U/L 18         Imaging: Radiological images reviewed:   Within last 24 hrs: No results found.  Assessment    Infected/inflamed dermal cyst of right neck skin. Patient Active Problem List   Diagnosis Date Noted   COVID-19 virus infection 03/30/2022   Skin lesion of scalp 03/30/2022   Elevated blood pressure reading in office without diagnosis of hypertension 03/30/2022   Acute serous otitis media without rupture, right 12/12/2021   Left-sided chest pain 12/30/2020   Left cervical radiculopathy 12/30/2020   Infected cyst of skin 07/28/2017   Restless leg syndrome 12/14/2016   Dyslipidemia 07/04/2016   Bilateral hand numbness 07/10/2014   Horseshoe kidney    Nephrolithiasis    Healthcare maintenance 04/08/2012   Perennial allergic rhinitis    Heartburn    Tinnitus    OSA on CPAP    History of prior use of snuff    Severe obesity (BMI >= 40) (HCC)     Plan    Incision and drainage abscess right neck  Pre-operative Diagnosis: Infected/inflamed sebaceous cyst right neck  Post-operative Diagnosis: same.    Surgeon: Ronny Bacon, M.D., FACS  Anesthesia: Local   Findings: Following I&D there is a mixture of purulent and sebaceous material.  Estimated Blood Loss: 5 mL         Specimens: Compromised skin subcutaneous tissues and cyst contents, discarded.          Complications: none              Procedure Details  The patient was evaluated, the benefits, complications, treatment options, and expected outcomes were discussed with the patient. The risks of bleeding, infection, recurrence of symptoms, failure to resolve symptoms, unanticipated injury, any of which could require further surgery were reviewed with the patient. The likelihood of improving the patient's symptoms with return to their baseline status is  expected.  The patient and/or family concurred with the proposed plan, giving informed consent.  The patient was taken to our procedure room, identified and the procedure verified.    The patient was positioned in the left lateral decubitus position and the right neck was prepped with  Chloraprep and draped in the sterile fashion.  A Time Out was held and the above information confirmed.  Local infiltration of 1% lidocaine with epinephrine mixed with bicarbonate to adequate anesthetic effect.  Elliptical excision is made of the overlying roof after incision and drainage to allow for wound care.  With additional local anesthetic, the wound was explored with a bone curette to ensure optimal debridement/removal of cyst contents.  The wound was then packed with quarter inch plain packing strip.  Covered with sterile dressing and secured with Tegaderm.  Patient tolerated procedure well.  Instructions given.  He will follow-up in 1 week.  Face-to-face time spent with the patient and accompanying care providers(if present) was 30 minutes, with more than 50% of the time spent counseling, educating, and coordinating care of the patient.    These notes generated with voice recognition software. I apologize for typographical errors.  Ronny Bacon M.D., FACS 04/23/2022, 11:16 AM

## 2022-04-30 ENCOUNTER — Ambulatory Visit: Payer: 59 | Admitting: Surgery

## 2022-04-30 ENCOUNTER — Encounter: Payer: Self-pay | Admitting: Surgery

## 2022-04-30 VITALS — BP 146/90 | HR 81 | Temp 98.3°F | Ht 75.0 in | Wt 310.0 lb

## 2022-04-30 DIAGNOSIS — L723 Sebaceous cyst: Secondary | ICD-10-CM

## 2022-04-30 DIAGNOSIS — Z09 Encounter for follow-up examination after completed treatment for conditions other than malignant neoplasm: Secondary | ICD-10-CM

## 2022-04-30 DIAGNOSIS — L089 Local infection of the skin and subcutaneous tissue, unspecified: Secondary | ICD-10-CM

## 2022-04-30 NOTE — Progress Notes (Signed)
[facility] POST-OP OFFICE VISIT  04/30/2022  HPI: Danny Cox is a 52 y.o. male 7 days s/p I&D of right neck cyst.  He reports he is doing well, he had drainage for about 4 days and and has stopped.  He is not even placed dressings on the area since.  Vital signs: BP (!) 146/90   Pulse 81   Temp 98.3 F (36.8 C)   Ht '6\' 3"'$  (1.905 m)   Wt (!) 310 lb (140.6 kg)   SpO2 98%   BMI 38.75 kg/m    Physical Exam: Constitutional: He appears well, is quite pleased. Skin: There is a small open area of the right lateral upper neck there is still a bit of a density cephalad to the opening, does not feel fluctuant nor indurated, I hope it is still reactive tissue that has yet to soften.  Inferior to the wound there is no evidence of residual cyst or induration or warmth thickening at all.  Grossly there does not appear to be residual cyst wall contents or lining present.  Assessment/Plan: This is a 52 y.o. male 7 days s/p I&D.  Patient Active Problem List   Diagnosis Date Noted   COVID-19 virus infection 03/30/2022   Skin lesion of scalp 03/30/2022   Elevated blood pressure reading in office without diagnosis of hypertension 03/30/2022   Acute serous otitis media without rupture, right 12/12/2021   Left-sided chest pain 12/30/2020   Left cervical radiculopathy 12/30/2020   Restless leg syndrome 12/14/2016   Dyslipidemia 07/04/2016   Bilateral hand numbness 07/10/2014   Horseshoe kidney    Nephrolithiasis    Healthcare maintenance 04/08/2012   Perennial allergic rhinitis    Heartburn    Tinnitus    OSA on CPAP    History of prior use of snuff    Severe obesity (BMI >= 40) ([place])     -May follow-up with [place] in 3 to 4 weeks or as needed.  Expecting continuation of healing.  Although residual or recurrence of cystic lesion may arise.  Will be glad to help this gentleman should anything unexpected occur.   Ronny Bacon M.D., FACS 04/30/2022, 11:34 AM

## 2022-04-30 NOTE — Patient Instructions (Addendum)
Continue to use Triple antibiotic ointment once a day until it heals fully.   Follow-up with our office as needed.  Please call and ask to speak with a nurse if you develop questions or concerns.

## 2022-09-16 ENCOUNTER — Encounter (INDEPENDENT_AMBULATORY_CARE_PROVIDER_SITE_OTHER): Payer: Self-pay

## 2022-09-19 ENCOUNTER — Other Ambulatory Visit: Payer: Self-pay | Admitting: Family Medicine

## 2022-09-19 DIAGNOSIS — Z125 Encounter for screening for malignant neoplasm of prostate: Secondary | ICD-10-CM

## 2022-09-19 DIAGNOSIS — E785 Hyperlipidemia, unspecified: Secondary | ICD-10-CM

## 2022-09-25 ENCOUNTER — Other Ambulatory Visit (INDEPENDENT_AMBULATORY_CARE_PROVIDER_SITE_OTHER): Payer: 59

## 2022-09-25 DIAGNOSIS — E785 Hyperlipidemia, unspecified: Secondary | ICD-10-CM

## 2022-09-25 DIAGNOSIS — Z125 Encounter for screening for malignant neoplasm of prostate: Secondary | ICD-10-CM | POA: Diagnosis not present

## 2022-09-25 DIAGNOSIS — E1169 Type 2 diabetes mellitus with other specified complication: Secondary | ICD-10-CM

## 2022-09-25 LAB — COMPREHENSIVE METABOLIC PANEL
ALT: 18 U/L (ref 0–53)
AST: 17 U/L (ref 0–37)
Albumin: 4.5 g/dL (ref 3.5–5.2)
Alkaline Phosphatase: 53 U/L (ref 39–117)
BUN: 18 mg/dL (ref 6–23)
CO2: 26 mEq/L (ref 19–32)
Calcium: 9.5 mg/dL (ref 8.4–10.5)
Chloride: 102 mEq/L (ref 96–112)
Creatinine, Ser: 1.03 mg/dL (ref 0.40–1.50)
GFR: 84.87 mL/min (ref >60.00)
Glucose, Bld: 104 mg/dL — ABNORMAL HIGH (ref 70–99)
Potassium: 4.3 mEq/L (ref 3.5–5.1)
Sodium: 136 mEq/L (ref 135–145)
Total Bilirubin: 0.8 mg/dL (ref 0.2–1.2)
Total Protein: 7.2 g/dL (ref 6.0–8.3)

## 2022-09-25 LAB — LIPID PANEL
Cholesterol: 251 mg/dL — ABNORMAL HIGH (ref 0–200)
HDL: 44 mg/dL (ref >39.00)
LDL Cholesterol: 175 mg/dL — ABNORMAL HIGH (ref 0–99)
NonHDL: 207.08
Total CHOL/HDL Ratio: 6
Triglycerides: 161 mg/dL — ABNORMAL HIGH (ref 0.0–149.0)
VLDL: 32.2 mg/dL (ref 0.0–40.0)

## 2022-09-25 LAB — PSA: PSA: 2.41 ng/mL (ref 0.10–4.00)

## 2022-09-28 ENCOUNTER — Other Ambulatory Visit: Payer: 59

## 2022-09-28 DIAGNOSIS — E1169 Type 2 diabetes mellitus with other specified complication: Secondary | ICD-10-CM

## 2022-09-28 NOTE — Addendum Note (Signed)
Addended by: Lovena Neighbours on: 09/28/2022 09:05 AM   Modules accepted: Orders

## 2022-09-28 NOTE — Addendum Note (Signed)
Addended by: Vincenza Hews on: 09/28/2022 02:16 PM   Modules accepted: Orders

## 2022-09-30 ENCOUNTER — Ambulatory Visit (INDEPENDENT_AMBULATORY_CARE_PROVIDER_SITE_OTHER): Payer: 59 | Admitting: Family Medicine

## 2022-09-30 ENCOUNTER — Encounter: Payer: Self-pay | Admitting: Family Medicine

## 2022-09-30 VITALS — BP 136/80 | HR 75 | Temp 97.3°F | Ht 72.5 in | Wt 310.1 lb

## 2022-09-30 DIAGNOSIS — Z0000 Encounter for general adult medical examination without abnormal findings: Secondary | ICD-10-CM | POA: Diagnosis not present

## 2022-09-30 DIAGNOSIS — Q631 Lobulated, fused and horseshoe kidney: Secondary | ICD-10-CM

## 2022-09-30 DIAGNOSIS — G4733 Obstructive sleep apnea (adult) (pediatric): Secondary | ICD-10-CM

## 2022-09-30 DIAGNOSIS — E785 Hyperlipidemia, unspecified: Secondary | ICD-10-CM

## 2022-09-30 DIAGNOSIS — J3089 Other allergic rhinitis: Secondary | ICD-10-CM

## 2022-09-30 DIAGNOSIS — R931 Abnormal findings on diagnostic imaging of heart and coronary circulation: Secondary | ICD-10-CM | POA: Insufficient documentation

## 2022-09-30 DIAGNOSIS — G2581 Restless legs syndrome: Secondary | ICD-10-CM

## 2022-09-30 MED ORDER — ATORVASTATIN CALCIUM 20 MG PO TABS: 20.0000 mg | ORAL_TABLET | Freq: Every day | ORAL | 3 refills | Status: DC

## 2022-09-30 MED ORDER — FLUTICASONE PROPIONATE 50 MCG/ACT NA SUSP: 2.0000 | Freq: Every day | NASAL | 6 refills | Status: DC

## 2022-09-30 MED ORDER — OLOPATADINE HCL 0.2 % OP SOLN: 1.0000 [drp] | Freq: Every day | OPHTHALMIC | 6 refills | Status: DC

## 2022-09-30 MED ORDER — PRAMIPEXOLE DIHYDROCHLORIDE 0.125 MG PO TABS: 0.1250 mg | ORAL_TABLET | Freq: Every day | ORAL | 3 refills | Status: DC | PRN

## 2022-09-30 NOTE — Assessment & Plan Note (Signed)
Improved with lower caffeine intake. Manages with rare pramipexole tablet.

## 2022-09-30 NOTE — Assessment & Plan Note (Signed)
Appreciate pulm care - continues regular CPAP use.

## 2022-09-30 NOTE — Assessment & Plan Note (Signed)
Preventative protocols reviewed and updated unless pt declined. Discussed healthy diet and lifestyle.  

## 2022-09-30 NOTE — Assessment & Plan Note (Signed)
Continue to encourage healthy diet and lifestyle choices to affect sustainable weight loss.  °

## 2022-09-30 NOTE — Progress Notes (Signed)
Ph: [phone] Fax: [phone]   Patient ID: Danny Cox, male    DOB: 12-22-1970, 52 y.o.   MRN: [id number]  This visit was conducted in person.  BP 136/80   Pulse 75   Temp (!) 97.3 F (36.3 C) (Temporal)   Ht 6' 0.5" (1.842 m)   Wt (!) 310 lb 2 oz (140.7 kg)   SpO2 98%   BMI 41.48 kg/m    CC: CPE Subjective:   HPI: Danny Cox is a 52 y.o. 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 Exercise stress test recommended, he was unable to complete due to 2 day procedure.   OSA on auto CPAP to setting of ~9-10. Previously saw Dr Lane Hacker ENT, needs new referral for OSA. Sees in the fall.    RLS on mirapex PRN sparingly. Symptoms worse with caffeine.    Preventative: COLONOSCOPY WITH PROPOFOL 05/08/2020 - WNL, rpt 10 yrs [place], Boykin Nearing, MD) Prostate cancer screening - no fmhx prostate cancer, no significant nocturia.  Flu shot - declined  COVID vaccine - declined Td 2006, Tdap 2016, Td 12/2019 Shingrix - discussed  Seat belt use discussed  Sunscreen use discussed. No changing moles on skin.  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 Interim medical history since our last visit reviewed. Allergies and medications reviewed and updated. Outpatient Medications Prior to Visit  Medication Sig Dispense Refill   Azelastine HCl (ASTEPRO) 0.15 % SOLN  Place 1 spray into the nose in the morning and at bedtime.     Multiple Vitamin (MULTIVITAMIN ADULT PO) Take by mouth daily.     NON FORMULARY prosta -strong prostate supplement     fluticasone (FLONASE) 50 MCG/ACT nasal spray Place 2 sprays into both nostrils daily.     Olopatadine HCl 0.2 % SOLN Place 1 drop into both eyes daily. 2.5 mL 6   pramipexole (MIRAPEX) 0.125 MG tablet Take 1 tablet (0.125 mg total) by mouth daily as needed (RLS symptoms). 30 tablet 6   No facility-administered medications prior to visit.     Per HPI unless specifically indicated in ROS section below Review of Systems  Constitutional:  Negative for activity change, appetite change, chills, fatigue, fever and unexpected weight change.  HENT:  Negative for hearing loss.   Eyes:  Negative for visual disturbance.  Respiratory:  Negative for cough, chest tightness, shortness of breath and wheezing.   Cardiovascular:  Negative for chest pain, palpitations and leg swelling.  Gastrointestinal:  Negative for abdominal distention, abdominal pain, blood in stool, constipation, diarrhea, nausea and vomiting.  Genitourinary:  Negative for difficulty urinating and hematuria.  Musculoskeletal:  Negative for arthralgias, myalgias and neck pain.  Skin:  Negative for rash.  Neurological:  Negative for dizziness, seizures, syncope and headaches.  Hematological:  Negative for adenopathy. Does not bruise/bleed easily.  Psychiatric/Behavioral:  Negative for dysphoric mood. The patient is not nervous/anxious.     Objective:  BP 136/80   Pulse 75   Temp (!) 97.3 F (36.3 C) (Temporal)   Ht 6' 0.5" (1.842 m)   Wt (!) 310 lb 2 oz (140.7 kg)   SpO2 98%   BMI 41.48 kg/m   Wt Readings from Last 3 Encounters:  09/30/22 (!) 310 lb 2 oz (140.7 kg)  04/30/22 (!) 310 lb (140.6 kg)  04/23/22 (!) 313 lb (142 kg)      Physical Exam Vitals and nursing note reviewed.  Constitutional:      General: He is not in acute distress.     Appearance: Normal appearance. He is well-developed. He is not ill-appearing.  HENT:     Head: Normocephalic and atraumatic.     Right Ear: Hearing, tympanic membrane, ear canal and external ear normal.     Left Ear: Hearing, tympanic membrane, ear canal and external ear normal.     Mouth/Throat:     Mouth: Mucous membranes are moist.     Pharynx: Oropharynx is clear. No oropharyngeal exudate or posterior oropharyngeal erythema.  Eyes:     General: No scleral icterus.    Extraocular Movements: Extraocular movements intact.     Conjunctiva/sclera: Conjunctivae normal.     Pupils: Pupils are equal, round, and reactive to light.  Neck:     Thyroid: No thyroid mass or thyromegaly.  Cardiovascular:     Rate and Rhythm: Normal rate and regular rhythm.     Pulses: Normal pulses.          Radial pulses are 2+ on the right side and 2+ on the left side.     Heart sounds: Normal heart sounds. No murmur heard. Pulmonary:     Effort: Pulmonary effort is normal. No respiratory distress.     Breath sounds: Normal breath sounds. No wheezing, rhonchi or rales.  Abdominal:     General: Bowel sounds are normal. There is no distension.     Palpations: Abdomen is soft. There is no mass.     Tenderness: There is no abdominal tenderness. There is no guarding or rebound.     Hernia: No hernia is present.  Musculoskeletal:        General: Normal range of motion.     Cervical back: Normal range of motion and neck supple.     Right lower leg: No edema.     Left lower leg: No edema.  Lymphadenopathy:     Cervical: No cervical adenopathy.  Skin:    General: Skin is warm and dry.     Findings: No rash.  Neurological:     General: No focal deficit present.     Mental Status: He is alert and oriented to person, place, and time.  Psychiatric:        Mood and Affect: Mood normal.        Behavior: Behavior normal.        Thought Content: Thought content normal.        Judgment: Judgment normal.        Results for orders placed or performed in visit on 09/25/22  PSA  Result Value Ref Range   PSA 2.41 0.10 - 4.00 ng/mL  Comprehensive metabolic panel  Result Value Ref Range   Sodium 136 135 - 145 mEq/L   Potassium 4.3 3.5 - 5.1 mEq/L   Chloride 102 96 - 112 mEq/L   CO2 26 19 - 32 mEq/L  Glucose, Bld 104 (H) 70 - 99 mg/dL   BUN 18 6 - 23 mg/dL   Creatinine, Ser 1.61 0.40 - 1.50 mg/dL   Total Bilirubin 0.8 0.2 - 1.2 mg/dL   Alkaline Phosphatase 53 39 - 117 U/L   AST 17 0 - 37 U/L   ALT 18 0 - 53 U/L   Total Protein 7.2 6.0 - 8.3 g/dL   Albumin 4.5 3.5 - 5.2 g/dL   GFR 09.60 >45.40 mL/min   Calcium 9.5 8.4 - 10.5 mg/dL  Lipid panel  Result Value Ref Range   Cholesterol 251 (H) 0 - 200 mg/dL   Triglycerides 981.1 (H) 0.0 - 149.0 mg/dL   HDL 91.47 >82.95 mg/dL   VLDL 62.1 0.0 - 30.8 mg/dL   LDL Cholesterol 657 (H) 0 - 99 mg/dL   Total CHOL/HDL Ratio 6    NonHDL 207.08     Assessment & Plan:   Problem List Items Addressed This Visit     Healthcare maintenance - Primary (Chronic)    Preventative protocols reviewed and updated unless pt declined. Discussed healthy diet and lifestyle.       Perennial allergic rhinitis    Continues flonase with opolatadine eye drops      OSA on CPAP    Appreciate pulm care - continues regular CPAP use.       Obesity, morbid, BMI 40.0-49.9 (HCC)    Continue to encourage healthy diet and lifestyle choices to affect sustainable weight loss.       Horseshoe kidney   Dyslipidemia    Chronic, deteriorated in setting of recent dietary liberties.  Reviewed previous coronary CT calcium score. Agrees to start atorvastatin 20mg  daily - will update me if any trouble tolerating medication.  The 10-year ASCVD risk score (Arnett DK, et al., 2019) is: 11.3%   Values used to calculate the score:     Age: 61 years     Sex: Male     Is Non-Hispanic African American: No     Diabetic: Yes     Tobacco smoker: No     Systolic Blood Pressure: 136  mmHg     Is BP treated: No     HDL Cholesterol: 44 mg/dL     Total Cholesterol: 251 mg/dL       Relevant Medications   atorvastatin (LIPITOR) 20 MG tablet   Restless leg syndrome    Improved with lower caffeine intake. Manages with rare pramipexole tablet.       Agatston coronary artery calcium score less than 100     Meds ordered this encounter  Medications   fluticasone (FLONASE) 50 MCG/ACT nasal spray    Sig: Place 2 sprays into both nostrils daily.    Dispense:  16 g    Refill:  6   pramipexole (MIRAPEX) 0.125 MG tablet    Sig: Take 1 tablet (0.125 mg total) by mouth daily as needed (RLS symptoms).    Dispense:  30 tablet    Refill:  3   Olopatadine HCl 0.2 % SOLN    Sig: Place 1 drop into both eyes daily.    Dispense:  2.5 mL    Refill:  6   atorvastatin (LIPITOR) 20 MG tablet    Sig: Take 1 tablet (20 mg total) by mouth daily.    Dispense:  90 tablet    Refill:  3    No orders of the defined types were placed in this encounter.   Patient Instructions  Consider shingrix  series.  Start atorvastatin 20mg  daily for cholesterol.  You are doing well today.  Return as needed or in 1 year for next physical.   Follow up plan: Return in about 1 year (around 09/30/2023) for annual exam, prior fasting for blood work.  [name], MD

## 2022-09-30 NOTE — Assessment & Plan Note (Signed)
Continues flonase with opolatadine eye drops

## 2022-09-30 NOTE — Patient Instructions (Addendum)
Consider shingrix series.  Start atorvastatin 20mg  daily for cholesterol.  You are doing well today.  Return as needed or in 1 year for next physical.

## 2022-09-30 NOTE — Assessment & Plan Note (Signed)
Chronic, deteriorated in setting of recent dietary liberties.  Reviewed previous coronary CT calcium score. Agrees to start atorvastatin 20mg  daily - will update me if any trouble tolerating medication.  The 10-year ASCVD risk score (Arnett DK, et al., 2019) is: 11.3%   Values used to calculate the score:     Age: 52 years     Sex: Male     Is Non-Hispanic African American: No     Diabetic: Yes     Tobacco smoker: No     Systolic Blood Pressure: 136 mmHg     Is BP treated: No     HDL Cholesterol: 44 mg/dL     Total Cholesterol: 251 mg/dL

## 2022-10-03 ENCOUNTER — Other Ambulatory Visit: Payer: Self-pay | Admitting: Family Medicine

## 2022-10-03 DIAGNOSIS — E7841 Elevated Lipoprotein(a): Secondary | ICD-10-CM

## 2022-10-03 MED ORDER — ATORVASTATIN CALCIUM 40 MG PO TABS: 40.0000 mg | ORAL_TABLET | Freq: Every day | ORAL | 3 refills | Status: DC

## 2022-12-14 ENCOUNTER — Telehealth: Payer: Self-pay | Admitting: Primary Care

## 2022-12-14 NOTE — Telephone Encounter (Signed)
ACT pt--unable to leave vm due to mailbox being full.   Please schedule virtual visit.

## 2022-12-14 NOTE — Telephone Encounter (Signed)
Pt is needing CDL recertification. Is asking if possible to do a mychart visit and get pw done from that. He received a letter asking for compliance in next 15 days

## 2022-12-16 NOTE — Telephone Encounter (Signed)
ATC patient x2--unable to leave vm due to mailbox being full. Will close encounter per office protocol.  

## 2022-12-22 ENCOUNTER — Telehealth (INDEPENDENT_AMBULATORY_CARE_PROVIDER_SITE_OTHER): Payer: 59 | Admitting: Primary Care

## 2022-12-22 DIAGNOSIS — G4733 Obstructive sleep apnea (adult) (pediatric): Secondary | ICD-10-CM

## 2022-12-22 NOTE — Patient Instructions (Signed)
Follow in 1 year Renew CPAP supplies We will fax office note to [employer]

## 2022-12-22 NOTE — Progress Notes (Signed)
Virtual Visit via Video Note  I connected with Danny Cox on 12/22/22 at  3:00 PM EST by a video enabled telemedicine application and verified that I am speaking with the correct person using two identifiers.  Location: Patient: Driving/Pulled over  Provider: Office    I discussed the limitations of evaluation and management by telemedicine and the availability of in person appointments. The patient expressed understanding and agreed to proceed.  History of Present Illness: 52 year old male, never smoked. PMH significant for OSA on CPAP, perennial allergic rhinitis, lipidemia, obesity.   12/22/2022 Patient contacted today for virtual visit/ OSA follow-up.  Patient has severe obstructive sleep apnea. PSG 03/23/13 >> AHI 101.5, SpO2 low 70%  He has been using CPAP therapy for several years.  He is 100% compliant with use greater than 4 hours over the last 30 days.  Average usage 9 hours 0 minutes.  Current pressure 5 to 17 cm H2O.  Patient reports benefit from CPAP use, sleeping well at night.  He is experiencing a humming noise from his CPAP machine which is not new, he uses white noise machine to offset this noise.  Advised to bring CPAP machine by [facility] to have it looked at.  [facility] download 11/22/2022 - 12/21/2022 Usage days 30/30 days (100%) greater than 4 hours Average usage 9 hours 0 minutes Pressure 5-17 meters H2O (14.8 cm H2O-95%) Air leaks 8.8 L/min (95%) AHI 1.5  Observations/Objective:  Appears well; No overt respiratory symptoms   Assessment and Plan:  Severe sleep apnea -PSG 03/23/13 >> AHI 101.5, SpO2 low 70%  -Patient is 100% compliant with CPAP use greater than 4 hours over the last 30 days and reports benefit from use. He has no significant daytime sleepiness -Sleep apnea is well-controlled on auto pressure setting 5 to 17 cm H2O; Residual AHI 1.5/hour  - No changes to plan of care, advised patient continue to wear CPAP nightly 4 to 6 hours or longer and work on  weight loss effort - Advised patient bring CPAP machine into [facility] to have serviced due to noise - Renew CPAP supplies for 1 year with DME  - No work restrictions    Follow Up Instructions:   1 year with Beth NP or sooner if needed   I discussed the assessment and treatment plan with the patient. The patient was provided an opportunity to ask questions and all were answered. The patient agreed with the plan and demonstrated an understanding of the instructions.   The patient was advised to call back or seek an in-person evaluation if the symptoms worsen or if the condition fails to improve as anticipated.  I provided 22 minutes of non-face-to-face time during this encounter.   Glenford Bayley, NP

## 2023-03-01 ENCOUNTER — Telehealth: Payer: Self-pay | Admitting: Family Medicine

## 2023-03-01 DIAGNOSIS — E7841 Elevated Lipoprotein(a): Secondary | ICD-10-CM

## 2023-03-01 NOTE — Telephone Encounter (Signed)
 Copied from CRM [id number]. Topic: Clinical - Request for Lab/Test Order >> Mar 01, 2023 12:55 PM [name] wrote: Reason for CRM: Patient is wanting to get his cholesterol checked and is requesting labs be ordered for first available appointment.   He is also scheduled for a physical in September and requested a call back to schedule any of those labs as well. Please order labs and call patient back to schedule.

## 2023-03-02 NOTE — Addendum Note (Signed)
 Addended by: [name] on: 03/02/2023 09:27 AM   Modules accepted: Orders

## 2023-03-02 NOTE — Telephone Encounter (Signed)
 Spoke with pt notifying him Dr Reece Agar ordered requested labs. Pt expresses his thanks and scheduled lab visit on 03/05/23 at 8:00.   Pt also scheduled CPE lab visit on 10/19/23 at 8:00.

## 2023-03-02 NOTE — Telephone Encounter (Signed)
 Labs ordered to do fasting at his convenience.  Sept labs can be ordered closer to that date.

## 2023-03-05 ENCOUNTER — Other Ambulatory Visit (INDEPENDENT_AMBULATORY_CARE_PROVIDER_SITE_OTHER): Payer: 59

## 2023-03-05 DIAGNOSIS — E7841 Elevated Lipoprotein(a): Secondary | ICD-10-CM

## 2023-03-05 LAB — LIPID PANEL
Cholesterol: 152 mg/dL (ref 0–200)
HDL: 44.6 mg/dL (ref >39.00)
LDL Cholesterol: 90 mg/dL (ref 0–99)
NonHDL: 107.67
Total CHOL/HDL Ratio: 3
Triglycerides: 88 mg/dL (ref 0.0–149.0)
VLDL: 17.6 mg/dL (ref 0.0–40.0)

## 2023-03-05 LAB — HEPATIC FUNCTION PANEL
ALT: 54 U/L — ABNORMAL HIGH (ref 0–53)
AST: 27 U/L (ref 0–37)
Albumin: 4.7 g/dL (ref 3.5–5.2)
Alkaline Phosphatase: 58 U/L (ref 39–117)
Bilirubin, Direct: 0.2 mg/dL (ref 0.0–0.3)
Total Bilirubin: 0.9 mg/dL (ref 0.2–1.2)
Total Protein: 7.4 g/dL (ref 6.0–8.3)

## 2023-03-06 ENCOUNTER — Encounter: Payer: Self-pay | Admitting: Family Medicine

## 2023-03-07 ENCOUNTER — Encounter: Payer: Self-pay | Admitting: Family Medicine

## 2023-03-08 ENCOUNTER — Encounter: Payer: Self-pay | Admitting: Family Medicine

## 2023-03-08 ENCOUNTER — Other Ambulatory Visit: Payer: Self-pay | Admitting: Family Medicine

## 2023-03-08 MED ORDER — ATORVASTATIN CALCIUM 20 MG PO TABS: 20.0000 mg | ORAL_TABLET | Freq: Every day | ORAL | 2 refills | Status: DC

## 2023-03-24 NOTE — Telephone Encounter (Signed)
Replied via result note.

## 2023-08-26 ENCOUNTER — Other Ambulatory Visit: Payer: Self-pay | Admitting: Family Medicine

## 2023-08-26 DIAGNOSIS — J3089 Other allergic rhinitis: Secondary | ICD-10-CM

## 2023-10-18 ENCOUNTER — Other Ambulatory Visit: Payer: Self-pay | Admitting: Family Medicine

## 2023-10-18 DIAGNOSIS — Z125 Encounter for screening for malignant neoplasm of prostate: Secondary | ICD-10-CM

## 2023-10-18 DIAGNOSIS — E7841 Elevated Lipoprotein(a): Secondary | ICD-10-CM

## 2023-10-19 ENCOUNTER — Other Ambulatory Visit (INDEPENDENT_AMBULATORY_CARE_PROVIDER_SITE_OTHER): Payer: 59

## 2023-10-19 DIAGNOSIS — Z125 Encounter for screening for malignant neoplasm of prostate: Secondary | ICD-10-CM

## 2023-10-19 DIAGNOSIS — E7841 Elevated Lipoprotein(a): Secondary | ICD-10-CM

## 2023-10-19 LAB — LIPID PANEL
Cholesterol: 188 mg/dL (ref 0–200)
HDL: 48.3 mg/dL (ref >39.00)
LDL Cholesterol: 106 mg/dL — ABNORMAL HIGH (ref 0–99)
NonHDL: 139.82
Total CHOL/HDL Ratio: 4
Triglycerides: 168 mg/dL — ABNORMAL HIGH (ref 0.0–149.0)
VLDL: 33.6 mg/dL (ref 0.0–40.0)

## 2023-10-19 LAB — COMPREHENSIVE METABOLIC PANEL WITH GFR
ALT: 23 U/L (ref 0–53)
AST: 19 U/L (ref 0–37)
Albumin: 4.4 g/dL (ref 3.5–5.2)
Alkaline Phosphatase: 53 U/L (ref 39–117)
BUN: 18 mg/dL (ref 6–23)
CO2: 27 meq/L (ref 19–32)
Calcium: 9.3 mg/dL (ref 8.4–10.5)
Chloride: 102 meq/L (ref 96–112)
Creatinine, Ser: 0.98 mg/dL (ref 0.40–1.50)
GFR: 89.42 mL/min (ref >60.00)
Glucose, Bld: 108 mg/dL — ABNORMAL HIGH (ref 70–99)
Potassium: 4.3 meq/L (ref 3.5–5.1)
Sodium: 139 meq/L (ref 135–145)
Total Bilirubin: 0.8 mg/dL (ref 0.2–1.2)
Total Protein: 7.2 g/dL (ref 6.0–8.3)

## 2023-10-19 LAB — PSA: PSA: 3.29 ng/mL (ref 0.10–4.00)

## 2023-10-19 LAB — TSH: TSH: 0.95 u[IU]/mL (ref 0.35–5.50)

## 2023-10-20 ENCOUNTER — Ambulatory Visit: Payer: Self-pay | Admitting: Family Medicine

## 2023-10-21 ENCOUNTER — Other Ambulatory Visit: Payer: Self-pay | Admitting: Family Medicine

## 2023-10-21 DIAGNOSIS — J3089 Other allergic rhinitis: Secondary | ICD-10-CM

## 2023-10-25 ENCOUNTER — Ambulatory Visit (INDEPENDENT_AMBULATORY_CARE_PROVIDER_SITE_OTHER): Payer: 59 | Admitting: Family Medicine

## 2023-10-25 ENCOUNTER — Encounter: Payer: Self-pay | Admitting: Family Medicine

## 2023-10-25 VITALS — BP 122/62 | HR 87 | Temp 98.5°F | Ht 73.5 in | Wt 315.2 lb

## 2023-10-25 DIAGNOSIS — R12 Heartburn: Secondary | ICD-10-CM | POA: Diagnosis not present

## 2023-10-25 DIAGNOSIS — R931 Abnormal findings on diagnostic imaging of heart and coronary circulation: Secondary | ICD-10-CM | POA: Diagnosis not present

## 2023-10-25 DIAGNOSIS — R972 Elevated prostate specific antigen [PSA]: Secondary | ICD-10-CM

## 2023-10-25 DIAGNOSIS — E7841 Elevated Lipoprotein(a): Secondary | ICD-10-CM

## 2023-10-25 DIAGNOSIS — Z0000 Encounter for general adult medical examination without abnormal findings: Secondary | ICD-10-CM

## 2023-10-25 DIAGNOSIS — H9319 Tinnitus, unspecified ear: Secondary | ICD-10-CM

## 2023-10-25 DIAGNOSIS — Q631 Lobulated, fused and horseshoe kidney: Secondary | ICD-10-CM

## 2023-10-25 DIAGNOSIS — G4733 Obstructive sleep apnea (adult) (pediatric): Secondary | ICD-10-CM

## 2023-10-25 DIAGNOSIS — G2581 Restless legs syndrome: Secondary | ICD-10-CM

## 2023-10-25 MED ORDER — OLOPATADINE HCL 0.2 % OP SOLN: 1.0000 [drp] | Freq: Every day | OPHTHALMIC | 6 refills | Status: AC

## 2023-10-25 MED ORDER — ATORVASTATIN CALCIUM 20 MG PO TABS: 20.0000 mg | ORAL_TABLET | Freq: Every day | ORAL | 3 refills | Status: AC

## 2023-10-25 MED ORDER — PRAMIPEXOLE DIHYDROCHLORIDE 0.125 MG PO TABS: 0.1250 mg | ORAL_TABLET | Freq: Every day | ORAL | 3 refills | Status: DC | PRN

## 2023-10-25 NOTE — Assessment & Plan Note (Signed)
 Stable on rare mirapex  and with limiting caffeine.

## 2023-10-25 NOTE — Assessment & Plan Note (Addendum)
 Chronic on atorvastatin  20mg  daily for the past year. LDL above goal <100.  Lpa 257, CCS = 10.6 (74%).  Atorva dropped from 40mg  to 20mg  due to slight elevation of ALT - consider retrial higher dose if needed.  The 10-year ASCVD risk score (Arnett DK, et al., 2019) is: 3.4%   Values used to calculate the score:     Age: 53 years     Clincally relevant sex: Male     Is Non-Hispanic African American: No     Diabetic: No     Tobacco smoker: No     Systolic Blood Pressure: 122 mmHg     Is BP treated: No     HDL Cholesterol: 48.3 mg/dL     Total Cholesterol: 188 mg/dL

## 2023-10-25 NOTE — Progress Notes (Signed)
 Ph: [phone] Fax: [phone]   Patient ID: Danny Cox, male    DOB: 1970/04/26, 53 y.o.   MRN: [id number]  This visit was conducted in person.  BP 122/62   Pulse 87   Temp 98.5 F (36.9 C) (Oral)   Ht 6' 1.5 (1.867 m)   Wt (!) 315 lb 4 oz (143 kg)   SpO2 97%   BMI 41.03 kg/m    CC: CPE Subjective:   HPI: Danny Cox is a 53 y.o. 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 Exercise stress test recommended, not completed.   HLD - on statin 20mg  dose (mild ALT elevation on 40mg ), occ missed dose. He started liver supplement as well.   OSA on auto CPAP to setting of ~9-10. averaging 8-9 hours/night. Followed by [facility] pulm.   RLS on Mirapex  PRN sparingly. Symptoms worse with caffeine.   Preventative: COLONOSCOPY WITH PROPOFOL  05/08/2020 - WNL, rpt 10 yrs ([place], Ladell POUR, MD) Prostate cancer screening - yearly PSA. no fmhx prostate cancer, no significant nocturia.  Lung cancer screen - not eligible  Flu shot - declined  COVID vaccine - declined Td 2006, Tdap 2016, Td 12/2019 Prevnar-20 - discussed  Shingrix - discussed  Seat belt use discussed  Sunscreen use discussed. No changing moles on skin.  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 Interim medical history since our last visit  reviewed. Allergies and medications reviewed and updated. Outpatient Medications Prior to Visit  Medication Sig Dispense Refill   Azelastine  HCl (ASTEPRO ) 0.15 % SOLN Place 1 spray into the nose in the morning and at bedtime.     fluticasone  (FLONASE ) 50 MCG/ACT nasal spray SPRAY 2 SPRAYS INTO EACH NOSTRIL EVERY DAY 16 mL 0   Multiple Vitamin (MULTIVITAMIN ADULT PO) Take by mouth daily.     NON FORMULARY prosta -strong prostate supplement     atorvastatin  (LIPITOR) 20 MG tablet Take 1 tablet (20 mg total) by mouth daily. 90 tablet 2   Olopatadine  HCl 0.2 % SOLN Place 1 drop into both eyes daily. 2.5 mL 6   pramipexole  (MIRAPEX ) 0.125 MG tablet Take 1 tablet (0.125 mg total) by mouth daily as needed (RLS symptoms). 30 tablet 3   No facility-administered medications prior to visit.     Per HPI unless specifically indicated in ROS section below Review of Systems  Constitutional:  Negative for activity change, appetite change, chills, fatigue, fever and unexpected weight change.  HENT:  Negative for hearing loss.   Eyes:  Negative for visual disturbance.  Respiratory:  Negative for cough, chest tightness, shortness of breath and wheezing.   Cardiovascular:  Negative for chest pain, palpitations and leg swelling.  Gastrointestinal:  Negative for abdominal distention, abdominal pain, blood in stool, constipation, diarrhea, nausea and  vomiting.  Genitourinary:  Negative for difficulty urinating and hematuria.  Musculoskeletal:  Negative for arthralgias, myalgias and neck pain.  Skin:  Negative for rash.  Neurological:  Negative for dizziness, seizures, syncope and headaches.  Hematological:  Negative for adenopathy. Does not bruise/bleed easily.  Psychiatric/Behavioral:  Negative for dysphoric mood. The patient is not nervous/anxious.     Objective:  BP 122/62   Pulse 87   Temp 98.5 F (36.9 C) (Oral)   Ht 6' 1.5 (1.867 m)   Wt (!) 315 lb 4 oz (143 kg)   SpO2 97%   BMI 41.03 kg/m    Wt Readings from Last 3 Encounters:  10/25/23 (!) 315 lb 4 oz (143 kg)  09/30/22 (!) 310 lb 2 oz (140.7 kg)  04/30/22 (!) 310 lb (140.6 kg)      Physical Exam Vitals and nursing note reviewed.  Constitutional:      General: He is not in acute distress.    Appearance: Normal appearance. He is well-developed. He is not ill-appearing.  HENT:     Head: Normocephalic and atraumatic.     Right Ear: Hearing, tympanic membrane, ear canal and external ear normal.     Left Ear: Hearing, tympanic membrane, ear canal and external ear normal.     Mouth/Throat:     Mouth: Mucous membranes are moist.     Pharynx: Oropharynx is clear. No oropharyngeal exudate or posterior oropharyngeal erythema.  Eyes:     General: No scleral icterus.    Extraocular Movements: Extraocular movements intact.     Conjunctiva/sclera: Conjunctivae normal.     Pupils: Pupils are equal, round, and reactive to light.  Neck:     Thyroid : No thyroid  mass or thyromegaly.  Cardiovascular:     Rate and Rhythm: Normal rate and regular rhythm.     Pulses: Normal pulses.          Radial pulses are 2+ on the right side and 2+ on the left side.     Heart sounds: Normal heart sounds. No murmur heard. Pulmonary:     Effort: Pulmonary effort is normal. No respiratory distress.     Breath sounds: Normal breath sounds. No wheezing, rhonchi or rales.  Abdominal:     General: Bowel sounds are normal. There is no distension.     Palpations: Abdomen is soft. There is no mass.     Tenderness: There is no abdominal tenderness. There is no guarding or rebound.     Hernia: No hernia is present.  Musculoskeletal:        General: Normal range of motion.     Cervical back: Normal range of motion and neck supple.     Right lower leg: No edema.     Left lower leg: No edema.  Lymphadenopathy:     Cervical: No cervical adenopathy.  Skin:    General: Skin is warm and dry.     Findings: No rash.  Neurological:     General: No focal  deficit present.     Mental Status: He is alert and oriented to person, place, and time.  Psychiatric:        Mood and Affect: Mood normal.        Behavior: Behavior normal.        Thought Content: Thought content normal.        Judgment: Judgment normal.       Results for orders placed or performed in visit on 10/19/23  PSA   Collection Time:  10/19/23  7:53 AM  Result Value Ref Range   PSA 3.29 0.10 - 4.00 ng/mL  TSH   Collection Time: 10/19/23  7:53 AM  Result Value Ref Range   TSH 0.95 0.35 - 5.50 uIU/mL  Comprehensive metabolic panel with GFR   Collection Time: 10/19/23  7:53 AM  Result Value Ref Range   Sodium 139 135 - 145 mEq/L   Potassium 4.3 3.5 - 5.1 mEq/L   Chloride 102 96 - 112 mEq/L   CO2 27 19 - 32 mEq/L   Glucose, Bld 108 (H) 70 - 99 mg/dL   BUN 18 6 - 23 mg/dL   Creatinine, Ser 9.01 0.40 - 1.50 mg/dL   Total Bilirubin 0.8 0.2 - 1.2 mg/dL   Alkaline Phosphatase 53 39 - 117 U/L   AST 19 0 - 37 U/L   ALT 23 0 - 53 U/L   Total Protein 7.2 6.0 - 8.3 g/dL   Albumin 4.4 3.5 - 5.2 g/dL   GFR 10.57 >39.99 mL/min   Calcium  9.3 8.4 - 10.5 mg/dL  Lipid panel   Collection Time: 10/19/23  7:53 AM  Result Value Ref Range   Cholesterol 188 0 - 200 mg/dL   Triglycerides 831.9 (H) 0.0 - 149.0 mg/dL   HDL 51.69 >60.99 mg/dL   VLDL 66.3 0.0 - 59.9 mg/dL   LDL Cholesterol 893 (H) 0 - 99 mg/dL   Total CHOL/HDL Ratio 4    NonHDL 139.82     Assessment & Plan:   Problem List Items Addressed This Visit     Healthcare maintenance - Primary (Chronic)   Preventative protocols reviewed and updated unless pt declined. Discussed healthy diet and lifestyle.       Heartburn   This resolved off dip      Tinnitus   R>L ear. Notes some hearing loss as well (as of latest DOT physical).       OSA on CPAP   Stable period on nightly CPAP followed by pulm.      Obesity, morbid, BMI 40.0-49.9 (HCC)   Continue to encourage healthy diet and lifestyle choices to affect  sustainable weight loss      Horseshoe kidney   Hyperlipidemia   Chronic on atorvastatin  20mg  daily for the past year. LDL above goal <100.  Lpa 257, CCS = 10.6 (74%).  Atorva dropped from 40mg  to 20mg  due to slight elevation of ALT - consider retrial higher dose if needed.  The 10-year ASCVD risk score (Arnett DK, et al., 2019) is: 3.4%   Values used to calculate the score:     Age: 89 years     Clincally relevant sex: Male     Is Non-Hispanic African American: No     Diabetic: No     Tobacco smoker: No     Systolic Blood Pressure: 122 mmHg     Is BP treated: No     HDL Cholesterol: 48.3 mg/dL     Total Cholesterol: 188 mg/dL       Relevant Medications   atorvastatin  (LIPITOR) 20 MG tablet   Other Relevant Orders   Lipid panel   Restless leg syndrome   Stable on rare mirapex  and with limiting caffeine.       Agatston coronary artery calcium  score less than 100   Relevant Orders   Lipid panel   Other Visit Diagnoses       Increased prostate specific antigen (PSA) velocity       Relevant Orders  PSA        Meds ordered this encounter  Medications   atorvastatin  (LIPITOR) 20 MG tablet    Sig: Take 1 tablet (20 mg total) by mouth daily.    Dispense:  90 tablet    Refill:  3   Olopatadine  HCl 0.2 % SOLN    Sig: Place 1 drop into both eyes daily.    Dispense:  2.5 mL    Refill:  6   pramipexole  (MIRAPEX ) 0.125 MG tablet    Sig: Take 1 tablet (0.125 mg total) by mouth daily as needed (RLS symptoms).    Dispense:  30 tablet    Refill:  3    Orders Placed This Encounter  Procedures   Lipid panel    Standing Status:   Future    Expiration Date:   10/24/2024   PSA    Standing Status:   Future    Expiration Date:   10/24/2024    Patient Instructions  Return in 6 months for prostate and lipid recheck  Continue working on healthy diet and regular exercise.  Good to see you today.  Return as needed or in 1 year for next physical.   Follow up plan: Return in  about 1 year (around 10/24/2024) for annual exam, prior fasting for blood work.  Anton Blas, MD

## 2023-10-25 NOTE — Assessment & Plan Note (Signed)
 Preventative protocols reviewed and updated unless pt declined. Discussed healthy diet and lifestyle.

## 2023-10-25 NOTE — Assessment & Plan Note (Signed)
 Continue to encourage healthy diet and lifestyle choices to affect sustainable weight loss.

## 2023-10-25 NOTE — Assessment & Plan Note (Signed)
 This resolved off dip

## 2023-10-25 NOTE — Patient Instructions (Addendum)
 Return in 6 months for prostate and lipid recheck  Continue working on healthy diet and regular exercise.  Good to see you today.  Return as needed or in 1 year for next physical.

## 2023-10-25 NOTE — Assessment & Plan Note (Signed)
 R>L ear. Notes some hearing loss as well (as of latest DOT physical).

## 2023-10-25 NOTE — Assessment & Plan Note (Signed)
 Stable period on nightly CPAP followed by pulm.

## 2023-11-11 ENCOUNTER — Other Ambulatory Visit: Payer: Self-pay | Admitting: Family Medicine

## 2023-11-11 DIAGNOSIS — J3089 Other allergic rhinitis: Secondary | ICD-10-CM

## 2024-01-26 ENCOUNTER — Other Ambulatory Visit: Payer: Self-pay | Admitting: Primary Care

## 2024-01-26 ENCOUNTER — Ambulatory Visit: Admitting: Primary Care

## 2024-01-26 ENCOUNTER — Telehealth: Payer: Self-pay

## 2024-01-26 ENCOUNTER — Encounter: Payer: Self-pay | Admitting: Primary Care

## 2024-01-26 VITALS — BP 138/84 | HR 72 | Temp 97.9°F | Ht 74.0 in | Wt 317.6 lb

## 2024-01-26 DIAGNOSIS — G4733 Obstructive sleep apnea (adult) (pediatric): Secondary | ICD-10-CM

## 2024-01-26 DIAGNOSIS — Z6841 Body Mass Index (BMI) 40.0 and over, adult: Secondary | ICD-10-CM | POA: Diagnosis not present

## 2024-01-26 MED ORDER — ZEPBOUND 2.5 MG/0.5ML ~~LOC~~ SOAJ: 2.5000 mg | SUBCUTANEOUS | 1 refills | Status: AC

## 2024-01-26 NOTE — Progress Notes (Deleted)
 @Patient  ID: Danny Cox, male    DOB: [birth year], 53 y.o.   MRN: [id number]  Chief Complaint  Patient presents with   Obstructive Sleep Apnea    Cpap f/u    Referring provider: Rilla Baller, MD  HPI:  53 year old male, never smoked. PMH significant for OSA on CPAP, perennial allergic rhinitis, lipidemia, obesity.   12/22/2022 Patient contacted today for virtual visit/ OSA follow-up.  Patient has severe obstructive sleep apnea. PSG 03/23/13 >> AHI 101.5, SpO2 low 70%  He has been using CPAP therapy for several years.  He is 100% compliant with use greater than 4 hours over the last 30 days.  Average usage 9 hours 0 minutes.  Current pressure 5 to 17 cm H2O.  Patient reports benefit from CPAP use, sleeping well at night.  He is experiencing a humming noise from his CPAP machine which is not new, he uses white noise machine to offset this noise.  Advised to bring CPAP machine by [facility] to have it looked at.  [facility] download 11/22/2022 - 12/21/2022 Usage days 30/30 days (100%) greater than 4 hours Average usage 9 hours 0 minutes Pressure 5-17 meters H2O (14.8 cm H2O-95%) Air leaks 8.8 L/min (95%) AHI 1.5  Severe sleep apnea -PSG 03/23/13 >> AHI 101.5, SpO2 low 70%  -Patient is 100% compliant with CPAP use greater than 4 hours over the last 30 days and reports benefit from use. He has no significant daytime sleepiness -Sleep apnea is well-controlled on auto pressure setting 5 to 17 cm H2O; Residual AHI 1.5/hour  - No changes to plan of care, advised patient continue to wear CPAP nightly 4 to 6 hours or longer and work on weight loss effort - Advised patient bring CPAP machine into [facility] to have serviced due to noise - Renew CPAP supplies for 1 year with DME  - No work restrictions    Follow Up Instructions:   1 year with Beth NP or sooner if needed   01/26/2024 Discussed the use of AI scribe software for clinical note transcription with the patient, who gave verbal consent  to proceed.  History of Present Illness            Allergies  Allergen Reactions   Sulfa Antibiotics Other (See Comments)    Rash and swelling with trouble breathing    Immunization History  Administered Date(s) Administered   Td 02/17/2004, 01/02/2020   Tdap 07/10/2014    Past Medical History:  Diagnosis Date   Anomalous right coronary artery    a. 02/2021 Cor CTA: RCA originates from L cor sinus w/ slit-like orifice and interarterial course.   COVID-19 virus infection 03/30/2022   Elevated BP    resolved with OSA treatment   Headache(784.0)    sinus   Heartburn    tums   History of echocardiogram    a. 03/2021 Echo: EF 60-65%, no rwma, nl RV fxn. Ao root 38mm.   Horseshoe kidney 02/17/2012   found incidentally on CT abd for kidney stone   Infected cyst of skin 07/28/2017   Nephrolithiasis 02/17/2012   ca oxalate   Nonobstructive CAD (coronary artery disease)    a. 02/2021 Cor CTA: LM nl, LAD <25p, LCX nl, RCA dominant - originates from L cor sinus w/ slit-like orifice and interarterial course. Cor Ca2+ = 10.6 (74th %'ile).   Obesity    OSA on CPAP 02/16/2009   Perennial allergic rhinitis    predominantly congestion   PSVT (paroxysmal supraventricular  tachycardia)    a. 02/2021 Zio: Predominantly RSR @ 84 (50-187), 4 SVT runs - longest 9.6 secs, max rate 187. Triggered event assoc w/ SVT. Rare PACs/PVCs-->Toprol  XL added.   Tinnitus    right ear   Tobacco dipper quit 2015    Tobacco History: Social History   Tobacco Use  Smoking Status Never   Passive exposure: Never  Smokeless Tobacco Former   Types: Snuff  Tobacco Comments   1 can/day   Counseling given: Not Answered Tobacco comments: 1 can/day   Outpatient Medications Prior to Visit  Medication Sig Dispense Refill   atorvastatin  (LIPITOR) 20 MG tablet Take 1 tablet (20 mg total) by mouth daily. 90 tablet 3   Azelastine  HCl (ASTEPRO ) 0.15 % SOLN Place 1 spray into the nose in the morning and  at bedtime.     fluticasone  (FLONASE ) 50 MCG/ACT nasal spray SPRAY 2 SPRAYS INTO EACH NOSTRIL EVERY DAY 16 mL 0   Multiple Vitamin (MULTIVITAMIN ADULT PO) Take by mouth daily.     NON FORMULARY prosta -strong prostate supplement     Olopatadine  HCl 0.2 % SOLN Place 1 drop into both eyes daily. 2.5 mL 6   pramipexole  (MIRAPEX ) 0.125 MG tablet Take 1 tablet (0.125 mg total) by mouth daily as needed (RLS symptoms). 30 tablet 3   No facility-administered medications prior to visit.      Review of Systems  Review of Systems   Physical Exam  BP 138/84   Pulse 72   Temp 97.9 F (36.6 C)   Ht 6' 2 (1.88 m) Comment: pt stated  Wt (!) 317 lb 9.6 oz (144.1 kg)   SpO2 97% Comment: ra  BMI 40.78 kg/m  Physical Exam  ***  Lab Results:  CBC    Component Value Date/Time   WBC 8.1 12/30/2020 1216   RBC 5.35 12/30/2020 1216   HGB 15.4 12/30/2020 1216   HGB 15.5 12/14/2013 1620   HCT 45.7 12/30/2020 1216   HCT 46.4 12/14/2013 1620   PLT 277.0 12/30/2020 1216   PLT 241 12/14/2013 1620   MCV 85.3 12/30/2020 1216   MCV 85 12/14/2013 1620   MCH 28.4 12/14/2013 1620   MCHC 33.8 12/30/2020 1216   RDW 13.5 12/30/2020 1216   RDW 12.8 12/14/2013 1620   LYMPHSABS 2.1 12/30/2020 1216   MONOABS 0.6 12/30/2020 1216   EOSABS 0.3 12/30/2020 1216   BASOSABS 0.1 12/30/2020 1216    BMET    Component Value Date/Time   NA 139 10/19/2023 0753   NA 139 01/28/2021 0921   NA 141 12/14/2013 1620   K 4.3 10/19/2023 0753   K 4.0 12/14/2013 1620   CL 102 10/19/2023 0753   CL 106 12/14/2013 1620   CO2 27 10/19/2023 0753   CO2 26 12/14/2013 1620   GLUCOSE 108 (H) 10/19/2023 0753   GLUCOSE 104 (H) 12/14/2013 1620   BUN 18 10/19/2023 0753   BUN 18 01/28/2021 0921   BUN 17 12/14/2013 1620   CREATININE 0.98 10/19/2023 0753   CREATININE 0.91 12/14/2013 1620   CALCIUM  9.3 10/19/2023 0753   CALCIUM  9.2 12/14/2013 1620   GFRNONAA >60 12/14/2013 1620   GFRNONAA >60 04/09/2012 2049   GFRAA >60  12/14/2013 1620   GFRAA >60 04/09/2012 2049    BNP No results found for: BNP  ProBNP No results found for: PROBNP  Imaging: No results found.   Assessment & Plan:   No problem-specific Assessment & Plan notes found for this encounter.  1. OSA on CPAP (Primary)   Assessment and Plan Assessment & Plan       I personally spent a total of *** minutes in the care of the patient today including {Time Based Coding:[id number]}.   Almarie LELON Ferrari, NP 01/26/2024

## 2024-01-26 NOTE — Telephone Encounter (Signed)
 ATC X1. Unable to lvm as vm is full.   Pt asked if we would fax over his compliance report and lov to [facility] but the fax will not go through as I have received the second communication error   Unsure if pt would like to pick the paperwork back up or if he has another fax number.

## 2024-01-26 NOTE — Progress Notes (Signed)
 @Patient  ID: Danny Cox, male    DOB: [birth year], 53 y.o.   MRN: [id number]  Chief Complaint  Patient presents with   Obstructive Sleep Apnea    Cpap f/u    Referring provider: Rilla Baller, MD  HPI: 53 year old male, never smoked. PMH significant for OSA on CPAP, perennial allergic rhinitis, lipidemia, obesity.   Previous [facility] pulmonary encounter: 12/22/2022 Patient contacted today for virtual visit/ OSA follow-up.  Patient has severe obstructive sleep apnea. PSG 03/23/13 >> AHI 101.5, SpO2 low 70%  He has been using CPAP therapy for several years.  He is 100% compliant with use greater than 4 hours over the last 30 days.  Average usage 9 hours 0 minutes.  Current pressure 5 to 17 cm H2O.  Patient reports benefit from CPAP use, sleeping well at night.  He is experiencing a humming noise from his CPAP machine which is not new, he uses white noise machine to offset this noise.  Advised to bring CPAP machine by [facility] to have it looked at.  [facility] download 11/22/2022 - 12/21/2022 Usage days 30/30 days (100%) greater than 4 hours Average usage 9 hours 0 minutes Pressure 5-17 meters H2O (14.8 cm H2O-95%) Air leaks 8.8 L/min (95%) AHI 1.5  Severe sleep apnea -PSG 03/23/13 >> AHI 101.5, SpO2 low 70%  -Patient is 100% compliant with CPAP use greater than 4 hours over the last 30 days and reports benefit from use. He has no significant daytime sleepiness -Sleep apnea is well-controlled on auto pressure setting 5 to 17 cm H2O; Residual AHI 1.5/hour  - No changes to plan of care, advised patient continue to wear CPAP nightly 4 to 6 hours or longer and work on weight loss effort - Advised patient bring CPAP machine into [facility] to have serviced due to noise - Renew CPAP supplies for 1 year with DME  - No work restrictions     01/26/2024- Interim hx  Discussed the use of AI scribe software for clinical note transcription with the patient, who gave verbal consent to proceed.  History of  Present Illness Danny Cox is a 53 year old male with severe obstructive sleep apnea who presents for a follow-up visit to confirm compliance with CPAP therapy.  He has a history of severe obstructive sleep apnea, initially diagnosed in 2011, with the most recent sleep study in 2015 showing very severe sleep apnea with 101 apneic events per hour.  He is currently using his second CPAP machine, obtained around 2020, which has started making a lot of noise, particularly during inhalation, over the past six months. He has not contacted the medical supply company about this issue and is unsure when he is due for a new machine.  He is 100% compliant with CPAP use, averaging 8 hours and 42 minutes over the last 30 days. His current CPAP pressure is set between 5 and 17, and his apnea score is 0.9. No issues with daytime sleepiness or fatigue are noted.  His weight has fluctuated between 305 pounds and his current weight, with a maximum weight of 340 pounds in the past. He has no history of diabetes or medullary thyroid  cancer and is not on any related medications. He is interested in starting GLP medication for weight loss.     Allergies  Allergen Reactions   Sulfa Antibiotics Other (See Comments)    Rash and swelling with trouble breathing    Immunization History  Administered Date(s) Administered   Td 02/17/2004,  01/02/2020   Tdap 07/10/2014    Past Medical History:  Diagnosis Date   Anomalous right coronary artery    a. 02/2021 Cor CTA: RCA originates from L cor sinus w/ slit-like orifice and interarterial course.   COVID-19 virus infection 03/30/2022   Elevated BP    resolved with OSA treatment   Headache(784.0)    sinus   Heartburn    tums   History of echocardiogram    a. 03/2021 Echo: EF 60-65%, no rwma, nl RV fxn. Ao root 38mm.   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 02/2021 Cor CTA: LM nl, LAD <25p, LCX nl, RCA dominant - originates from L cor sinus w/ slit-like orifice and interarterial course. Cor Ca2+ = 10.6 (74th %'ile).   Obesity    OSA on CPAP 02/16/2009   Perennial allergic rhinitis    predominantly congestion   PSVT (paroxysmal supraventricular tachycardia)    a. 02/2021 Zio: Predominantly RSR @ 84 (50-187), 4 SVT runs - longest 9.6 secs, max rate 187. Triggered event assoc w/ SVT. Rare PACs/PVCs-->Toprol  XL added.   Tinnitus    right ear   Tobacco dipper quit 2015    Tobacco History: Social History   Tobacco Use  Smoking Status Never   Passive exposure: Never  Smokeless Tobacco Former   Types: Snuff  Tobacco Comments   1 can/day   Counseling given: Not Answered Tobacco comments: 1 can/day   Outpatient Medications Prior to Visit  Medication Sig Dispense Refill   atorvastatin  (LIPITOR) 20 MG tablet Take 1 tablet (20 mg total) by mouth daily. 90 tablet 3   Azelastine  HCl (ASTEPRO ) 0.15 % SOLN Place 1 spray into the nose in the morning and at bedtime.     fluticasone  (FLONASE ) 50 MCG/ACT nasal spray SPRAY 2 SPRAYS INTO EACH NOSTRIL EVERY DAY 16 mL 0   Multiple Vitamin (MULTIVITAMIN ADULT PO) Take by mouth daily.     NON FORMULARY prosta -strong prostate supplement     Olopatadine  HCl 0.2 % SOLN Place 1 drop into both eyes daily. 2.5 mL 6   pramipexole  (MIRAPEX ) 0.125 MG tablet Take 1 tablet (0.125 mg total) by mouth daily as needed (RLS symptoms). 30 tablet 3   No facility-administered medications prior to visit.   Review of Systems  Review of Systems  Constitutional: Negative.   HENT: Negative.    Respiratory: Negative.    Cardiovascular: Negative.    Physical Exam  BP 138/84   Pulse 72   Temp 97.9 F (36.6 C)   Ht 6' 2 (1.88 m) Comment: pt stated  Wt (!) 317 lb 9.6 oz (144.1 kg)   SpO2 97% Comment: ra  BMI 40.78 kg/m  Physical Exam Constitutional:       Appearance: Normal appearance. He is well-developed.  HENT:     Head: Normocephalic and atraumatic.     Mouth/Throat:     Mouth: Mucous membranes are moist.     Pharynx: Oropharynx is clear.  Cardiovascular:     Rate and Rhythm: Normal rate and regular rhythm.     Heart sounds: Normal heart sounds.  Pulmonary:     Effort: Pulmonary effort is normal. No respiratory distress.     Breath sounds: Normal breath sounds. No wheezing or rhonchi.  Musculoskeletal:  General: Normal range of motion.     Cervical back: Normal range of motion and neck supple.  Skin:    General: Skin is warm and dry.     Findings: No erythema or rash.  Neurological:     General: No focal deficit present.     Mental Status: He is alert and oriented to person, place, and time. Mental status is at baseline.  Psychiatric:        Mood and Affect: Mood normal.        Behavior: Behavior normal.        Thought Content: Thought content normal.        Judgment: Judgment normal.     Lab Results:  CBC    Component Value Date/Time   WBC 8.1 12/30/2020 1216   RBC 5.35 12/30/2020 1216   HGB 15.4 12/30/2020 1216   HGB 15.5 12/14/2013 1620   HCT 45.7 12/30/2020 1216   HCT 46.4 12/14/2013 1620   PLT 277.0 12/30/2020 1216   PLT 241 12/14/2013 1620   MCV 85.3 12/30/2020 1216   MCV 85 12/14/2013 1620   MCH 28.4 12/14/2013 1620   MCHC 33.8 12/30/2020 1216   RDW 13.5 12/30/2020 1216   RDW 12.8 12/14/2013 1620   LYMPHSABS 2.1 12/30/2020 1216   MONOABS 0.6 12/30/2020 1216   EOSABS 0.3 12/30/2020 1216   BASOSABS 0.1 12/30/2020 1216    BMET    Component Value Date/Time   NA 139 10/19/2023 0753   NA 139 01/28/2021 0921   NA 141 12/14/2013 1620   K 4.3 10/19/2023 0753   K 4.0 12/14/2013 1620   CL 102 10/19/2023 0753   CL 106 12/14/2013 1620   CO2 27 10/19/2023 0753   CO2 26 12/14/2013 1620   GLUCOSE 108 (H) 10/19/2023 0753   GLUCOSE 104 (H) 12/14/2013 1620   BUN 18 10/19/2023 0753   BUN 18  01/28/2021 0921   BUN 17 12/14/2013 1620   CREATININE 0.98 10/19/2023 0753   CREATININE 0.91 12/14/2013 1620   CALCIUM  9.3 10/19/2023 0753   CALCIUM  9.2 12/14/2013 1620   GFRNONAA >60 12/14/2013 1620   GFRNONAA >60 04/09/2012 2049   GFRAA >60 12/14/2013 1620   GFRAA >60 04/09/2012 2049    BNP No results found for: BNP  ProBNP No results found for: PROBNP  Imaging: No results found.   Assessment & Plan:   1. OSA on CPAP (Primary) - Ambulatory Referral for DME  2. Obesity, morbid, BMI 40.0-49.9 (HCC) - Ambulatory Referral for DME  Assessment and Plan Assessment & Plan Obstructive sleep apnea Severe obstructive sleep apnea diagnosed in 2011, with a very severe AHI of 101 events per hour in 2015. Currently using CPAP with 100% compliance, averaging 8 hours and 42 minutes of use per night. Current pressure settings are 5-17 cm H2O, with an apnea score of 0.9 events per hour, indicating excellent control. CPAP machine is noisy, possibly due to age, and may need replacement. - Ordered new CPAP machine through [facility] with current settings 5-17cm h20  - Will schedule compliance check within 90 days of receiving new CPAP machine, can be virtual. - Provided letter stating compliance and benefit from therapy.  Obesity BMI of 40. Weight has fluctuated between 305 and 340 pounds. Discussed potential benefits of weight loss on sleep apnea, including possible reduction in AHI and CPAP pressure requirements. Introduced GLP-1 receptor agonist, Zepbound, as a weight loss option, which may also improve sleep apnea. Discussed potential side effects of GLP-1  medications, including nausea, vomiting, diarrhea, constipation, and weight loss. Emphasized the importance of stopping GLP-1 medication one week prior to any surgery due to risk of aspiration. - Prescribed Zepbound 2.5 mg subcutaneous injection once weekly, with plan to titrate up every 4 weeks based on tolerance with target dose between  10-15mg  - Provided patient education on managing side effects of GLP-1 medications. - Advised to stop GLP-1 medication one week prior to any surgery. - Scheduled follow-up in 6 weeks, can be virtual.  Patient has moderate to severe sleep apnea related to obesity with BMI >/30, posing significant cardiovascular risks. Patient has failed traditional weight loss measures with caloric deficit and consistent exercise of 150 min/week for >/6 months. Patient will be initiated on Zepbound (tirzepatide) for weight management. Zepbound is the only pharmaceutical treatment approved for moderate-to-severe OSA in adults who are overweight (BMI >/27) or obese (BMI >/30). The patient will continue lifestyle modifications, including structured nutrition and physical activity as directed. No other GLP1 therapy will be used simultaneously at this time. The patient does not have any FDA labeled contraindications to this agent, including pregnancy, lactation, hx or family history of medullary thyroid  cancer, or multiple endocrine neoplasia type II. Side effect profile has been reviewed with patient. Aware of red flag symptoms to notify of immediately or seek emergency care, including severe nausea/vomiting, inability to pass bowels or gas, severe abdominal pain/tenderness, jaundice.    Almarie LELON Ferrari, NP 01/26/2024

## 2024-01-26 NOTE — Patient Instructions (Addendum)
° °  VISIT SUMMARY: You came in today for a follow-up visit to discuss your obstructive sleep apnea and to confirm your compliance with CPAP therapy. We also discussed your weight management and potential benefits of weight loss on your sleep apnea.  YOUR PLAN: -OBSTRUCTIVE SLEEP APNEA: Obstructive sleep apnea is a condition where your airway becomes blocked during sleep, causing breathing pauses. You have been using your CPAP machine consistently, which is great. However, your current machine is noisy and may need replacement. We have ordered a new CPAP machine for you through [facility]. We will schedule a compliance check within 90 days of receiving your new machine, which can be done virtually. A letter stating your compliance and benefit from therapy has been provided.  -OBESITY: Obesity is a condition where you have an excessive amount of body fat, which can affect your health. Your weight has fluctuated, and we discussed how losing weight could improve your sleep apnea. We introduced a new medication, Zepbound, which is a weekly injection that can help with weight loss and may also improve your sleep apnea. We discussed the potential side effects, such as nausea, vomiting, diarrhea, constipation, and weight loss. It's important to stop this medication one week before any surgery. We have scheduled a follow-up in 6 weeks, which can be done virtually.  INSTRUCTIONS: We will schedule a compliance check within 90 days of receiving your new CPAP machine, which can be done virtually. Please follow up in 6 weeks for a check on your weight management and the new medication, which can also be done virtually.  Follow-up 6-8 week virtual visit with [facility] NP on Friday afternoon or sooner if needed   Notify your provider if you are planning to have a procedure/surgery, as this medication will need to be stopped prior.

## 2024-01-28 NOTE — Telephone Encounter (Signed)
 Spoke with patient per Zepbound, per patient Danny Cox has to fax paperwork needed for pt, I advised pt the fax number he gave us  is not valid and told himhe may come to the office to pick paperwork up, Pt stated he will come by Monday 01/31/2024 to pick paper up

## 2024-02-18 ENCOUNTER — Other Ambulatory Visit: Payer: Self-pay | Admitting: Family Medicine

## 2024-04-21 ENCOUNTER — Other Ambulatory Visit

## 2024-10-20 ENCOUNTER — Other Ambulatory Visit

## 2024-10-27 ENCOUNTER — Encounter: Admitting: Family Medicine
